# Patient Record
Sex: Male | Born: 1980 | Race: White | Hispanic: No | Marital: Single | Smoking: Never smoker
Health system: Southern US, Community
[De-identification: ages and names within clinical notes are randomized; demographics above are authoritative.]

## PROBLEM LIST (undated history)

## (undated) DIAGNOSIS — I10 Essential (primary) hypertension: Secondary | ICD-10-CM

## (undated) HISTORY — PX: NO PAST SURGERIES: SHX2092

---

## 2009-08-29 ENCOUNTER — Emergency Department: Payer: Self-pay | Admitting: Emergency Medicine

## 2015-02-28 DIAGNOSIS — F32A Depression, unspecified: Secondary | ICD-10-CM | POA: Insufficient documentation

## 2015-02-28 DIAGNOSIS — I10 Essential (primary) hypertension: Secondary | ICD-10-CM | POA: Insufficient documentation

## 2015-08-03 ENCOUNTER — Encounter: Payer: Self-pay | Admitting: Emergency Medicine

## 2015-08-03 ENCOUNTER — Emergency Department
Admission: EM | Admit: 2015-08-03 | Discharge: 2015-08-03 | Disposition: A | Discharge status: Home / Self Care | Payer: 59 | Attending: Emergency Medicine | Admitting: Emergency Medicine

## 2015-08-03 ENCOUNTER — Emergency Department: Payer: 59

## 2015-08-03 DIAGNOSIS — I10 Essential (primary) hypertension: Secondary | ICD-10-CM | POA: Insufficient documentation

## 2015-08-03 DIAGNOSIS — F17210 Nicotine dependence, cigarettes, uncomplicated: Secondary | ICD-10-CM | POA: Insufficient documentation

## 2015-08-03 DIAGNOSIS — M545 Low back pain, unspecified: Secondary | ICD-10-CM

## 2015-08-03 DIAGNOSIS — M546 Pain in thoracic spine: Secondary | ICD-10-CM | POA: Diagnosis not present

## 2015-08-03 DIAGNOSIS — R200 Anesthesia of skin: Secondary | ICD-10-CM | POA: Diagnosis present

## 2015-08-03 HISTORY — DX: Essential (primary) hypertension: I10

## 2015-08-03 LAB — BASIC METABOLIC PANEL
Anion gap: 10 (ref 5–15)
BUN: 11 mg/dL (ref 6–20)
CALCIUM: 9.7 mg/dL (ref 8.9–10.3)
CO2: 22 mmol/L (ref 22–32)
CREATININE: 1.18 mg/dL (ref 0.61–1.24)
Chloride: 108 mmol/L (ref 101–111)
Glucose, Bld: 136 mg/dL — ABNORMAL HIGH (ref 65–99)
Potassium: 3.6 mmol/L (ref 3.5–5.1)
SODIUM: 140 mmol/L (ref 135–145)

## 2015-08-03 LAB — CBC
HCT: 48.8 % (ref 40.0–52.0)
Hemoglobin: 16.7 g/dL (ref 13.0–18.0)
MCH: 30 pg (ref 26.0–34.0)
MCHC: 34.3 g/dL (ref 32.0–36.0)
MCV: 87.4 fL (ref 80.0–100.0)
PLATELETS: 180 10*3/uL (ref 150–440)
RBC: 5.58 MIL/uL (ref 4.40–5.90)
RDW: 13.1 % (ref 11.5–14.5)
WBC: 12.4 10*3/uL — AB (ref 3.8–10.6)

## 2015-08-03 LAB — GLUCOSE, CAPILLARY: GLUCOSE-CAPILLARY: 102 mg/dL — AB (ref 65–99)

## 2015-08-03 LAB — TROPONIN I

## 2015-08-03 MED ORDER — IBUPROFEN 800 MG PO TABS: 800.0000 mg | ORAL_TABLET | Freq: Three times a day (TID) | ORAL | Status: DC | PRN

## 2015-08-03 MED ORDER — IBUPROFEN 800 MG PO TABS
800.0000 mg | ORAL_TABLET | Freq: Once | ORAL | Status: AC
  Administered 2015-08-03: 800 mg via ORAL
  Filled 2015-08-03: qty 1

## 2015-08-03 MED ORDER — SODIUM CHLORIDE 0.9 % IV BOLUS (SEPSIS): 1000.0000 mL | Freq: Once | INTRAVENOUS | Status: DC

## 2015-08-03 NOTE — ED Notes (Signed)
Pt reports that he does not know of any incident that could have caused his back pain. Pt reports that the pain is both sharp and throbbing and comes on in waves. Pt rates pain 10/10 at this time.

## 2015-08-03 NOTE — Discharge Instructions (Signed)
Please apply a heat pack to your back for 15 minutes every 2 hours. Please take Motrin for its pain control and anti-inflammatory processes every 8 hours for the next 3 days. After that take the Motrin as needed.  Please make an appointment with your primary care physician. Please call the office and they will give you a refill on your pressure prescription medicine. Next  Please return to the emergency department if he develops severe pain, numbness, tingling, fainting, fever, inability to walk, or any other symptoms concerning to you.

## 2015-08-03 NOTE — ED Notes (Signed)
Dr. Sharma CovertNorman aware of vitals at d/c.  No additional orders other than to follow up (as discussed) with PMD

## 2015-08-03 NOTE — ED Notes (Signed)
C/o lower back pain that started when he woke up this am, denies any urinary symptoms, also states he started having some left arm numbness that started around 1715

## 2015-08-03 NOTE — ED Provider Notes (Signed)
[facility] Emergency Department Provider Note  ____________________________________________  Time seen: Approximately 7:14 PM  I have reviewed the triage vital signs and the nursing notes.   HISTORY  Chief Complaint Numbness and Back Pain    HPI Jacob Moreno is a 34 y.o. male with a history of obesity and hypertension not currently on medication presenting with mid to low back pain. Patient reports that he was turning over to get out of bed when he had the acute onset of mid back pain. The pain is worse with some positional changes and worse if he lays still for a long time. It does not radiate. He does not have any associated numbness, tingling, urinary or fecal incontinence or retention. He has no changes in his gait. He does have some left upper extremity numbness that has improved since he has been here; he says he gets this numbness "all the time." The numbness seeks experiencing tonight is unchanged in character and severity from his chronic symptoms. Pt denies any chest pain, palpitations, shortness of breath, headache, neck pain, visual changes, gait changes.   Past Medical History  Diagnosis Date  . Hypertension     There are no active problems to display for this patient.   History reviewed. No pertinent past surgical history.  Current Outpatient Rx  Name  Route  Sig  Dispense  Refill  . ibuprofen (ADVIL,MOTRIN) 800 MG tablet   Oral   Take 1 tablet (800 mg total) by mouth every 8 (eight) hours as needed for moderate pain (with food).   20 tablet   0     Allergies Review of patient's allergies indicates no known allergies.  No family history on file.  Social History Social History  Substance Use Topics  . Smoking status: Current Every Day Smoker    Types: Cigars  . Smokeless tobacco: None  . Alcohol Use: No    Review of Systems Constitutional: No fever/chills. No lightheadedness or syncope. Eyes: No visual changes. No blurred  or double vision. ENT: No sore throat. NECK: No neck pain. Cardiovascular: Denies chest pain, palpitations. Respiratory: Denies shortness of breath.  No cough. Gastrointestinal: No abdominal pain.  No nausea, no vomiting.  No diarrhea.  No constipation. Genitourinary: Negative for dysuria. Musculoskeletal: Positive for back pain. Skin: Negative for rash. Neurological: Left upper extremity numbness which is chronic. No headache. No weakness. No changes in gait. No changes in vision or speech. No fecal or urinary incontinence or retention. 10-point ROS otherwise negative.  ____________________________________________   PHYSICAL EXAM:  VITAL SIGNS: ED Triage Vitals  Enc Vitals Group     BP 08/03/15 1825 179/105 mmHg     Pulse Rate 08/03/15 1825 65     Resp 08/03/15 1825 18     Temp 08/03/15 1825 98.6 F (37 C)     Temp Source 08/03/15 1825 Oral     SpO2 08/03/15 1825 100 %     Weight 08/03/15 1825 250 lb (113.399 kg)     Height 08/03/15 1825  (1.803 m)     Head Cir --      Peak Flow --      Pain Score 08/03/15 1826 10     Pain Loc --      Pain Edu? --      Excl. in GC? --     Constitutional: Alert and oriented. Well appearing and in no acute distress. Answer question appropriately. Patient is able to move around in the stretcher  lie down, sit up, and stand up without any obvious pain. Eyes: Conjunctivae are normal.  EOMI. Head: Atraumatic. Nose: No congestion/rhinnorhea. Mouth/Throat: Mucous membranes are moist.  Neck: No stridor.  Supple.  No midline C-spine tenderness to palpation, no step-offs or deformities. Cardiovascular: Normal rate, regular rhythm. No murmurs, rubs or gallops.  Respiratory: Normal respiratory effort.  No retractions. Lungs CTAB.  No wheezes, rales or ronchi. Gastrointestinal: Obese, soft, nontender, nondistended.  Musculoskeletal: No LE edema. 5 out of 5 plantar and dorsiflexion strength, hip flexor strength. No tenderness to palpation in the  midline T or L-spine, no step-offs or deformities. Unable to reproduce pain with palpation diffusely throughout the back in the area that the patient states his pain is. Skin is normal without lesions. Neurologic:  Normal speech and language. Moves all extremities well. Normal sensation to light touch in the face, upper extremities bilaterally, lower extremities bilaterally. Normal gait without ataxia.  Skin:  Skin is warm, dry and intact. No rash noted. Psychiatric: Flat and depressed affect.Marland Kitchen Speech and behavior are normal.  Normal judgement.  ____________________________________________   LABS (all labs ordered are listed, but only abnormal results are displayed)  Labs Reviewed  BASIC METABOLIC PANEL - Abnormal; Notable for the following:    Glucose, Bld 136 (*)    All other components within normal limits  CBC - Abnormal; Notable for the following:    WBC 12.4 (*)    All other components within normal limits  GLUCOSE, CAPILLARY - Abnormal; Notable for the following:    Glucose-Capillary 102 (*)    All other components within normal limits  TROPONIN I  CBG MONITORING, ED   ____________________________________________  EKG  ED ECG REPORT I, Rockne Menghini, the attending physician, personally viewed and interpreted this ECG.   Date: 08/03/2015  EKG Time: 1830  Rate: 58sinus bradycardia  Rhythm: sinus bradycardia  Axis: Leftward  Intervals:none  ST&T Change: Nonspecific T-wave inversion at V1. No ST elevation. Ischemic changes.  ____________________________________________  RADIOLOGY  Ct Head Wo Contrast  08/03/2015  CLINICAL DATA:  lower back pain that started when he woke up this am, denies any urinary symptoms, also states he started having some left arm numbness that started around 1715. Denies trauma. EXAM: CT HEAD WITHOUT CONTRAST TECHNIQUE: Contiguous axial images were obtained from the base of the skull through the vertex without intravenous contrast.  COMPARISON:  None. FINDINGS: There is no evidence of acute intracranial hemorrhage, brain edema, mass lesion, acute infarction, mass effect, or midline shift. Acute infarct may be inapparent on noncontrast CT. No other intra-axial abnormalities are seen, and the ventricles and sulci are within normal limits in size and symmetry. No abnormal extra-axial fluid collections or masses are identified. No significant calvarial abnormality. IMPRESSION: 1. Negative for bleed or other acute intracranial process. Electronically Signed   By: Corlis Leak M.D.   On: 08/03/2015 18:43    ____________________________________________   PROCEDURES  Procedure(s) performed: None  Critical Care performed: No ____________________________________________   INITIAL IMPRESSION / ASSESSMENT AND PLAN / ED COURSE  Pertinent labs & imaging results that were available during my care of the patient were reviewed by me and considered in my medical decision making (see chart for details).  34 y.o. male with a history of obesity and chronic left upper extremity numbness presenting with acute onset of mid to low back pain with movement. Patient does not have any urinary symptoms or infectious symptoms. He does not have any signs of cauda equina or spinal  cord compression. He does not have any signs of acute CVA. The patient's exam and history are not indicative of aortic pathology. The patient does have some hypertension but has been off of his medications; he does not remember which antihypertensive he is supposed to be on. At this time, I will discharge him with treatment with anti-inflammatories and heat therapy. He will call his primary care physician's office tomorrow to get a refill on his chronic antihypertensives.  ____________________________________________  FINAL CLINICAL IMPRESSION(S) / ED DIAGNOSES  Final diagnoses:  Bilateral low back pain without sciatica  Midline thoracic back pain  Left arm numbness       NEW MEDICATIONS STARTED DURING THIS VISIT:  Discharge Medication List as of 08/03/2015  7:23 PM    START taking these medications   Details  ibuprofen (ADVIL,MOTRIN) 800 MG tablet Take 1 tablet (800 mg total) by mouth every 8 (eight) hours as needed for moderate pain (with food)., Starting 08/03/2015, Until Discontinued, Print         Rockne MenghiniAnne-Caroline Donta Mcinroy, MD 08/03/15 2234

## 2016-11-30 IMAGING — CT CT HEAD W/O CM
1 series · 16 of 30 positions shown, 20 images · non-contrast
Comparison: None.

CLINICAL DATA: lower back pain that started when he woke up this
left arm numbness that started around 5859. Denies trauma.

EXAM:
CT HEAD WITHOUT CONTRAST
TECHNIQUE: Contiguous axial images were obtained from the base of the skull
through the vertex without intravenous contrast.

[Series 2: head wo · axial · 0.45mm/px · z∈[+472,+616]mm · 16 of 36 slices shown, 20 images]
[im 2/36  brain]
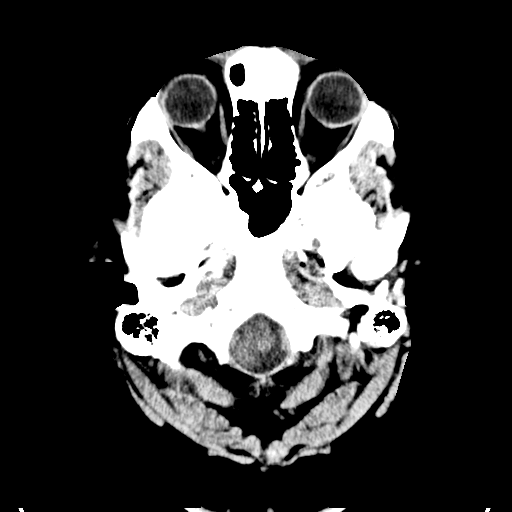
[im 2/36  bone]
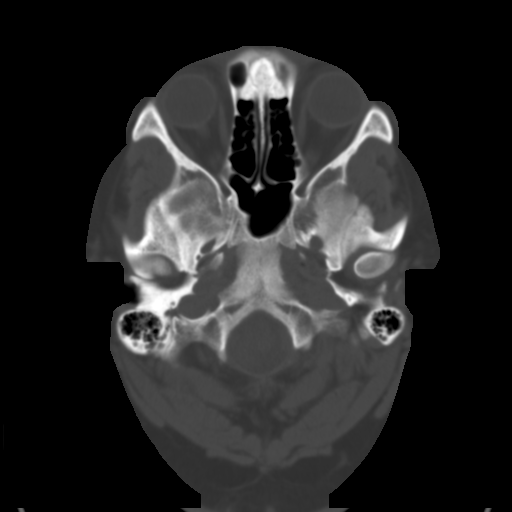
[im 4/36  brain]
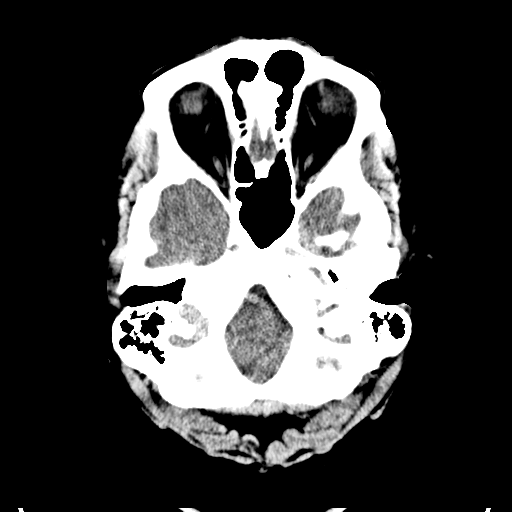
[im 7/36  brain]
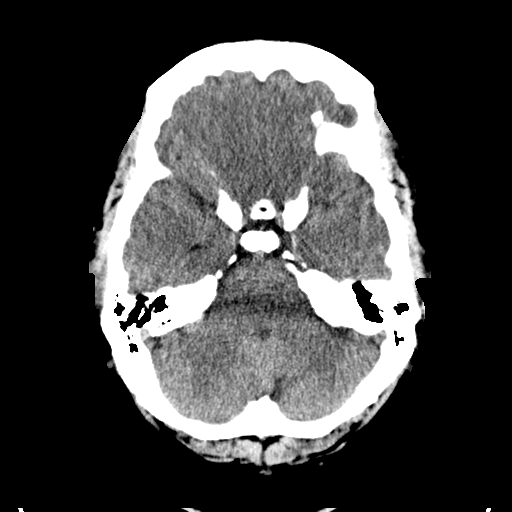
[im 9/36  brain]
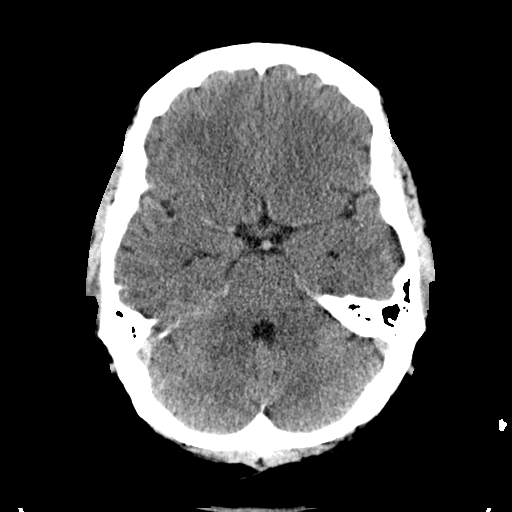
[im 10/36  brain]
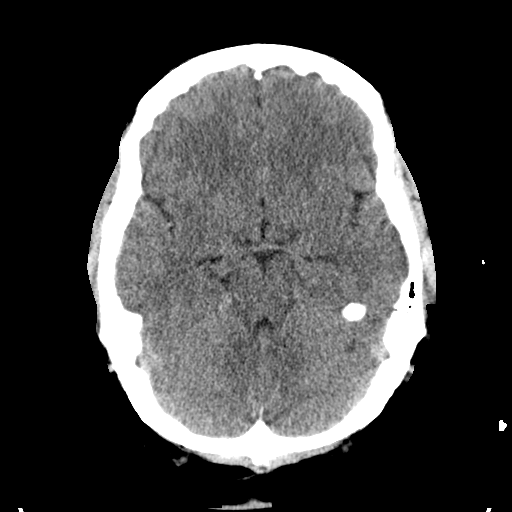
[im 10/36  bone]
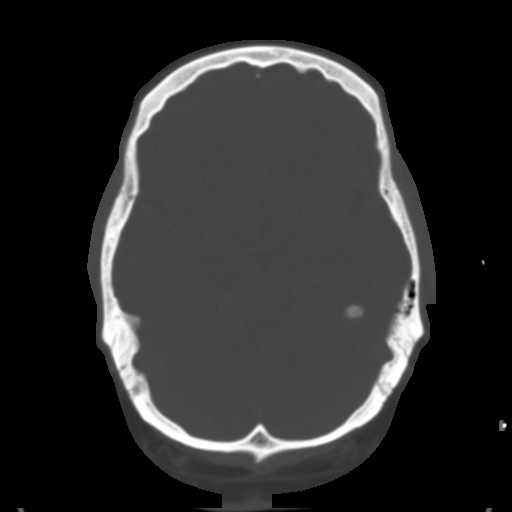
[im 13/36  brain]
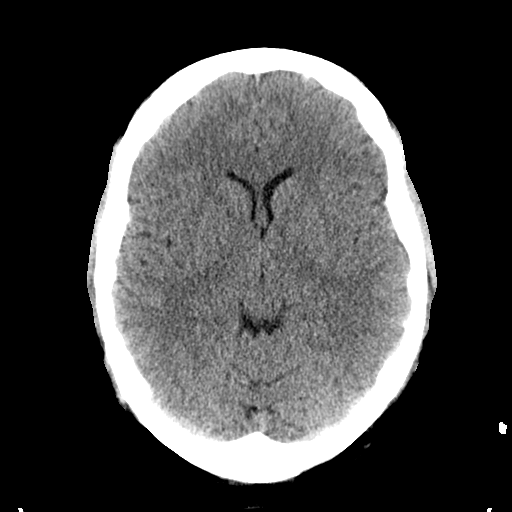
[im 15/36  brain]
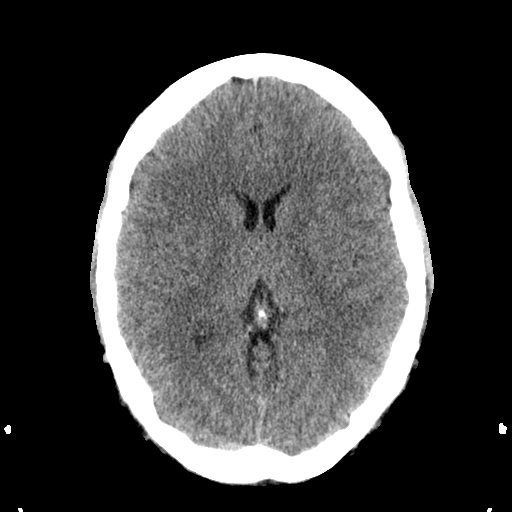
[im 17/36  brain]
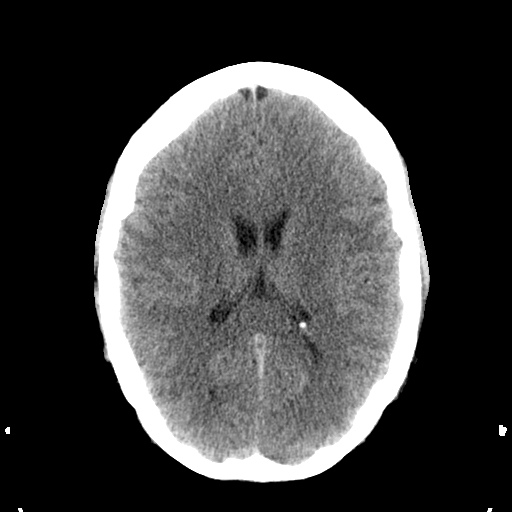
[im 19/36  brain]
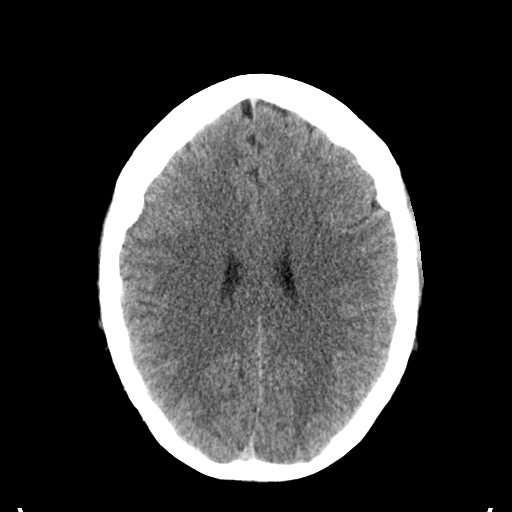
[im 19/36  bone]
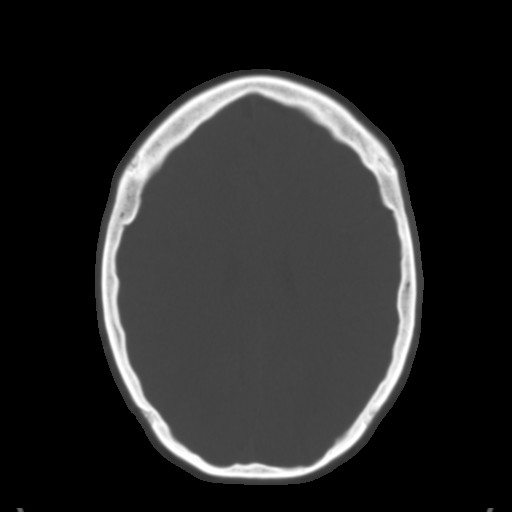
[im 21/36  brain]
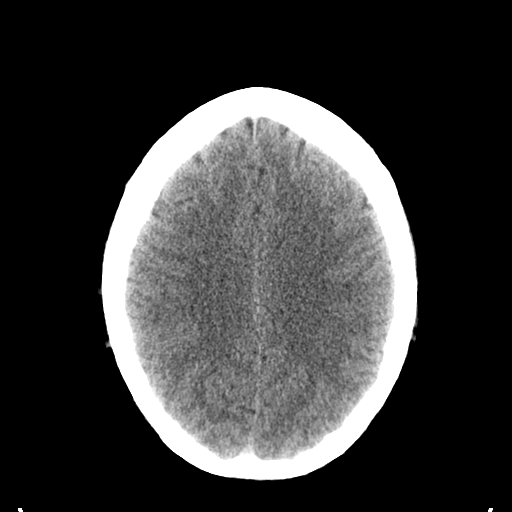
[im 23/36  brain]
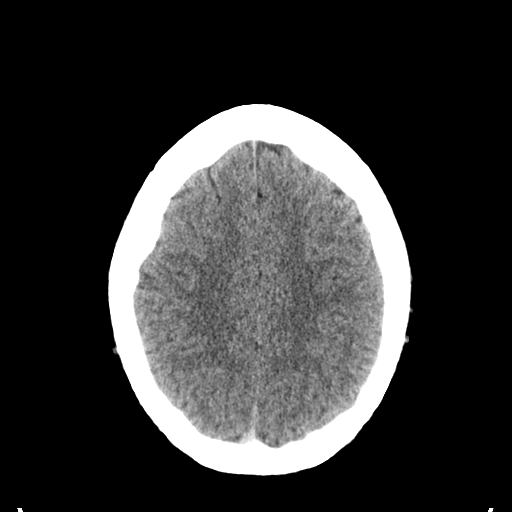
[im 26/36  brain]
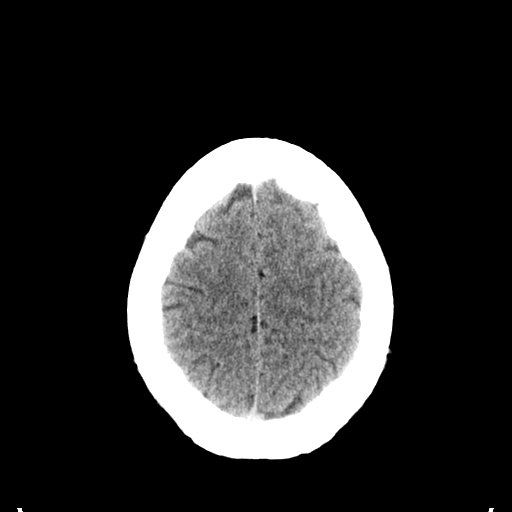
[im 27/36  brain]
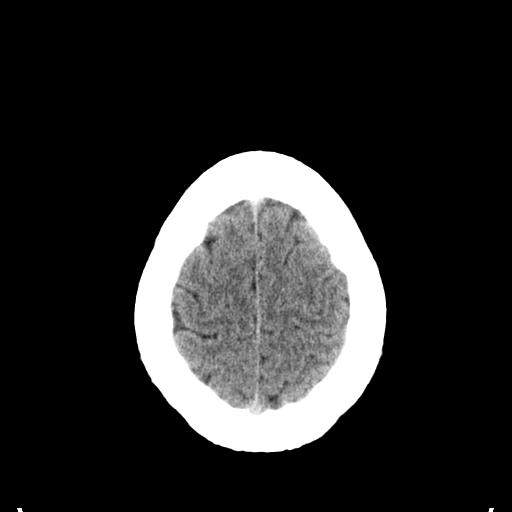
[im 27/36  bone]
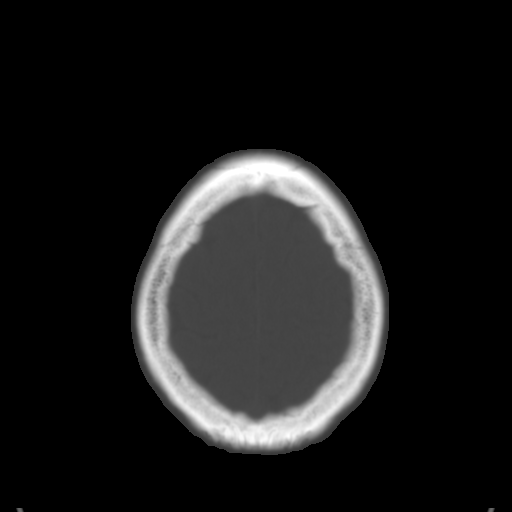
[im 29/36  brain]
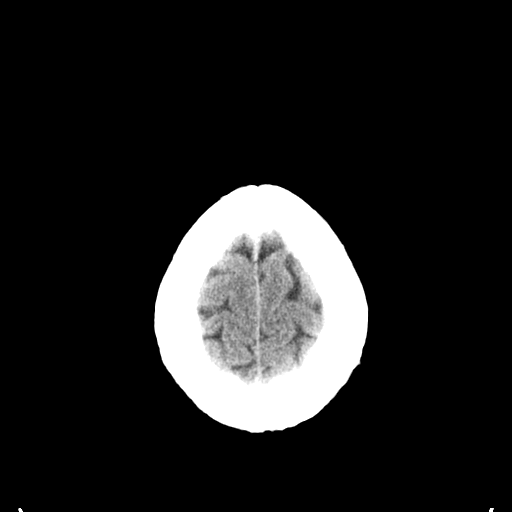
[im 32/36  brain]
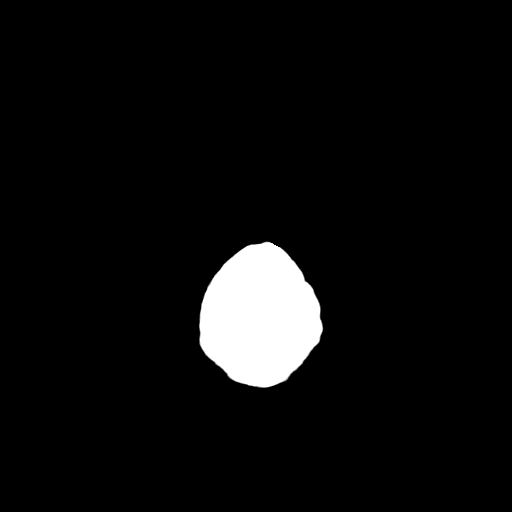
[im 34/36  brain]
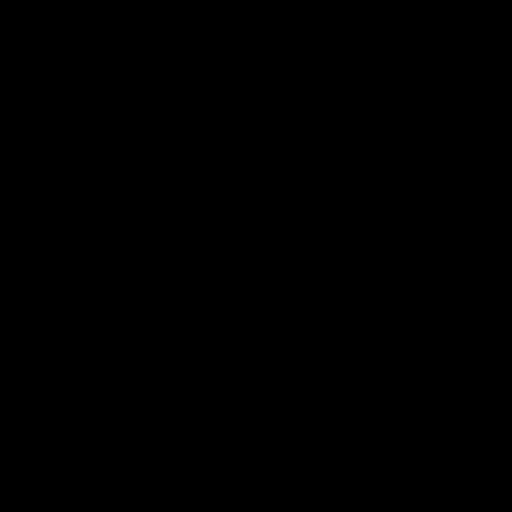

[16 of 30 positions shown; findings below may reference images not displayed]

FINDINGS: There is no evidence of acute intracranial hemorrhage, brain edema,
mass lesion, acute infarction, mass effect, or midline shift. Acute
infarct may be inapparent on noncontrast CT. No other intra-axial
abnormalities are seen, and the ventricles and sulci are within
normal limits in size and symmetry. No abnormal extra-axial fluid
collections or masses are identified. No significant calvarial
abnormality.
IMPRESSION: 1. Negative for bleed or other acute intracranial process.

## 2019-09-20 ENCOUNTER — Emergency Department
Admission: EM | Admit: 2019-09-20 | Discharge: 2019-09-20 | Disposition: A | Discharge status: Home / Self Care | Payer: BC Managed Care – PPO | Attending: Student | Admitting: Student

## 2019-09-20 ENCOUNTER — Other Ambulatory Visit: Payer: Self-pay

## 2019-09-20 ENCOUNTER — Emergency Department: Payer: BC Managed Care – PPO

## 2019-09-20 DIAGNOSIS — R109 Unspecified abdominal pain: Secondary | ICD-10-CM | POA: Diagnosis not present

## 2019-09-20 DIAGNOSIS — I10 Essential (primary) hypertension: Secondary | ICD-10-CM | POA: Insufficient documentation

## 2019-09-20 DIAGNOSIS — F17290 Nicotine dependence, other tobacco product, uncomplicated: Secondary | ICD-10-CM | POA: Diagnosis not present

## 2019-09-20 DIAGNOSIS — R1012 Left upper quadrant pain: Secondary | ICD-10-CM | POA: Diagnosis not present

## 2019-09-20 DIAGNOSIS — N201 Calculus of ureter: Secondary | ICD-10-CM

## 2019-09-20 LAB — COMPREHENSIVE METABOLIC PANEL
ALT: 35 U/L (ref 0–44)
AST: 19 U/L (ref 15–41)
Albumin: 4.3 g/dL (ref 3.5–5.0)
Alkaline Phosphatase: 45 U/L (ref 38–126)
Anion gap: 10 (ref 5–15)
BUN: 16 mg/dL (ref 6–20)
CO2: 26 mmol/L (ref 22–32)
Calcium: 9.2 mg/dL (ref 8.9–10.3)
Chloride: 104 mmol/L (ref 98–111)
Creatinine, Ser: 1.16 mg/dL (ref 0.61–1.24)
GFR calc Af Amer: >60 mL/min (ref >60)
GFR calc non Af Amer: >60 mL/min (ref >60)
Glucose, Bld: 112 mg/dL — ABNORMAL HIGH (ref 70–99)
Potassium: 4.2 mmol/L (ref 3.5–5.1)
Sodium: 140 mmol/L (ref 135–145)
Total Bilirubin: 1.7 mg/dL — ABNORMAL HIGH (ref 0.3–1.2)
Total Protein: 7.5 g/dL (ref 6.5–8.1)

## 2019-09-20 LAB — CBC
HCT: 47.2 % (ref 39.0–52.0)
Hemoglobin: 16.4 g/dL (ref 13.0–17.0)
MCH: 30.1 pg (ref 26.0–34.0)
MCHC: 34.7 g/dL (ref 30.0–36.0)
MCV: 86.8 fL (ref 80.0–100.0)
Platelets: 145 10*3/uL — ABNORMAL LOW (ref 150–400)
RBC: 5.44 MIL/uL (ref 4.22–5.81)
RDW: 12.6 % (ref 11.5–15.5)
WBC: 15 10*3/uL — ABNORMAL HIGH (ref 4.0–10.5)
nRBC: 0 % (ref 0.0–0.2)

## 2019-09-20 LAB — URINALYSIS, ROUTINE W REFLEX MICROSCOPIC
Bacteria, UA: NONE SEEN
Bilirubin Urine: NEGATIVE
Glucose, UA: NEGATIVE mg/dL
Ketones, ur: NEGATIVE mg/dL
Leukocytes,Ua: NEGATIVE
Nitrite: NEGATIVE
Protein, ur: NEGATIVE mg/dL
Specific Gravity, Urine: 1.017 (ref 1.005–1.030)
Squamous Epithelial / LPF: NONE SEEN (ref 0–5)
pH: 6 (ref 5.0–8.0)

## 2019-09-20 MED ORDER — OXYCODONE HCL 5 MG PO TABS: 5.0000 mg | ORAL_TABLET | Freq: Four times a day (QID) | ORAL | 0 refills | Status: AC | PRN

## 2019-09-20 MED ORDER — HYDROCHLOROTHIAZIDE 12.5 MG PO TABS: 12.5000 mg | ORAL_TABLET | Freq: Every day | ORAL | 0 refills | Status: DC

## 2019-09-20 MED ORDER — ONDANSETRON 4 MG PO TBDP
4.0000 mg | ORAL_TABLET | Freq: Once | ORAL | Status: AC
  Administered 2019-09-20: 16:00:00 4 mg via ORAL
  Filled 2019-09-20: qty 1

## 2019-09-20 MED ORDER — OXYCODONE HCL 5 MG PO TABS
5.0000 mg | ORAL_TABLET | Freq: Once | ORAL | Status: AC
  Administered 2019-09-20: 16:00:00 5 mg via ORAL
  Filled 2019-09-20: qty 1

## 2019-09-20 MED ORDER — ONDANSETRON HCL 4 MG PO TABS: 4.0000 mg | ORAL_TABLET | Freq: Three times a day (TID) | ORAL | 0 refills | Status: AC | PRN

## 2019-09-20 NOTE — ED Provider Notes (Signed)
[facility] Emergency Department Provider Note  ____________________________________________   First MD Initiated Contact with Patient 09/20/19 1514     (approximate)  I have reviewed the triage vital signs and the nursing notes.  History  Chief Complaint Flank Pain    HPI Jacob Moreno is a 39 y.o. male who presents emergency department for left-sided flank pain.  Patient states his symptoms first started on Friday.  Pain has been constant since onset, but acutely worsened in severity last night, prompting evaluation.  Pain is located to the left flank, radiates somewhat into his groin.  Described as sharp.  6/10 in severity.  No alleviating or aggravating factors.  He has some discomfort with urination, but denies any hematuria or malodorous urine.  He denies any history of kidney stones.  One episode of vomiting earlier today, which he attributes to pain.  None since then.  No fevers.  As an aside, patient has a history of hypertension, was previously on medication but has not been on anything recently due to financial constraints.  He states he now has insurance and would like to restart his medications.  Was previously on hydrochlorothiazide daily and tolerated it well.  No history of kidney disease.   Past Medical Hx Past Medical History:  Diagnosis Date  . Hypertension     Problem List There are no problems to display for this patient.   Past Surgical Hx No past surgical history on file.  Medications Prior to Admission medications   Medication Sig Start Date End Date Taking? Authorizing Provider  ibuprofen (ADVIL,MOTRIN) 800 MG tablet Take 1 tablet (800 mg total) by mouth every 8 (eight) hours as needed for moderate pain (with food). 08/03/15   Rockne Menghini, MD    Allergies Patient has no known allergies.  Family Hx No family history on file.  Social Hx Social History   Tobacco Use  . Smoking status: Current Every Day  Smoker    Types: Cigars  Substance Use Topics  . Alcohol use: No  . Drug use: Not on file     Review of Systems  Constitutional: Negative for fever, chills. Eyes: Negative for visual changes. ENT: Negative for sore throat. Cardiovascular: Negative for chest pain. Respiratory: Negative for shortness of breath. Gastrointestinal: Negative for nausea, vomiting.  Genitourinary: + for dysuria and LEFT flank pain. Musculoskeletal: Negative for leg swelling. Skin: Negative for rash. Neurological: Negative for for headaches.   Physical Exam  Vital Signs: ED Triage Vitals  Enc Vitals Group     BP 09/20/19 1436 (!) 190/115     Pulse Rate 09/20/19 1436 71     Resp 09/20/19 1436 18     Temp 09/20/19 1436 99.3 F (37.4 C)     Temp Source 09/20/19 1436 Oral     SpO2 09/20/19 1436 98 %     Weight 09/20/19 1453 238 lb (108 kg)     Height 09/20/19 1453 5\' 10"  (1.778 m)     Head Circumference --      Peak Flow --      Pain Score 09/20/19 1452 6     Pain Loc --      Pain Edu? --      Excl. in GC? --     Constitutional: Alert and oriented.  Head: Normocephalic. Atraumatic. Eyes: Conjunctivae clear. Sclera anicteric. Nose: No congestion. No rhinorrhea. Mouth/Throat: Wearing mask.  Neck: No stridor.   Cardiovascular: Normal rate, regular rhythm. Extremities well perfused. Respiratory: Normal  respiratory effort.  Lungs CTAB. Gastrointestinal: Soft. Non-tender. Non-distended.  Musculoskeletal: No lower extremity edema. No deformities. Neurologic:  Normal speech and language. No gross focal neurologic deficits are appreciated.  Skin: Skin is warm, dry and intact. No rash noted. Psychiatric: Mood and affect are appropriate for situation.   Radiology  CT Renal: IMPRESSION:  1. Mild to moderate left hydronephrosis due to a 0.3 cm distal left  ureteral stone. The stone is just above the level of the left hip.  2. Multiple small nonobstructing stones in both kidneys, more  numerous  on the right.  3. Fatty infiltration of the liver.  4. Gallstones without evidence of cholecystitis.    Procedures  Procedure(s) performed (including critical care):  Procedures   Initial Impression / Assessment and Plan / ED Course  39 y.o. male who presents to the ED for LEFT sided flank pain, as above. History suggestive of nephrolithiasis  Labs reveal mild leukocytosis. UA w/o evidence of infection. CT confirms left sided stone, as above. No evidence of concomitant infection. Pain well controlled in the ED and tolerated PO. As such, will plan for discharge with Rx for pain, nausea control and urology follow up. Will restart his HCTZ per his request given Cr within normal limits and given referral for re-establishing PCP. Patient comfortable w/ plan and discharge. Given return precautions.     Final Clinical Impression(s) / ED Diagnosis  Final diagnoses:  Left flank pain  Ureterolithiasis       Note:  This document was prepared using Dragon voice recognition software and may include unintentional dictation errors.   Lilia Pro., MD 09/20/19 2006

## 2019-09-20 NOTE — Discharge Instructions (Addendum)
Thank you for letting us take care of you in the emergency department today.   Please continue to take any regular, prescribed medications.   New medications we have prescribed:  - blood pressure medicine (hydrochlorothiazide)  - pain medication - take only as needed for pain you cannot control with over the counter ibuprofen or Tylenol - nausea medication - take as needed  Please follow up with: - A primary care doctor to establish care, information below - Urology doctor to follow up on your stone, info below  Please return to the ER for any new or worsening symptoms.

## 2019-09-20 NOTE — ED Triage Notes (Addendum)
Pt states he started having left sided flank pain on Friday, states that the pain was much worse last night, states that he has noticed some discomfort with urination as well, denies hx of kidney stone. Pt states his last dose of bp med was a year ago, so states that his pressure is high when he doesn't take his meds, pt states he now has insurance and would like to get back on it

## 2019-09-20 NOTE — ED Notes (Signed)
Signature pad not working. Pt verbal understanding of discharge instructions, no further questions or concerns.

## 2019-09-20 NOTE — ED Notes (Signed)
Pt declined a snack when offered, states he will eat when he leaves the hospital .

## 2019-09-20 NOTE — ED Notes (Signed)
Pt drinking ice water with no complaints of N/V

## 2019-09-20 NOTE — ED Notes (Signed)
Patient transported to CT 

## 2019-09-20 NOTE — ED Notes (Signed)
See triage note, pt states he is here because he believes he has kidney stones. States he is having left sided flank pain that started on Friday and worsened yesterday. Reports pain with urination.  Pt in NAD at this time

## 2020-01-11 NOTE — Progress Notes (Signed)
Patient ID: Jacob Moreno, male    DOB: Aug 18, 1981, 39 y.o.   MRN: [id number]  PCP: Jamelle Haring, MD  Chief Complaint  Patient presents with  . New Patient (Initial Visit)  . Establish Care    Subjective:   Jacob Moreno is a 39 y.o. male, presents to clinic with CC of the following:  Chief Complaint  Patient presents with  . New Patient (Initial Visit)  . Establish Care    HPI:  Patient is a 39 year old male who presents today new to the practice He has a history of hypertension, kidney stones, fatty liver and anxiety/depression.  He was seen in the emergency room 09/20/2019 for left flank pain, diagnosed with a kidney stone At that time, he was noted to be off his medications, and hydrochlorothiazide was restarted for better control of his blood pressure (was 190/115 on that visit) CT scan from that visit showed: CT Renal: IMPRESSION:  1. Mild to moderate left hydronephrosis due to a 0.3 cm distal left  ureteral stone. The stone is just above the level of the left hip.  2. Multiple small nonobstructing stones in both kidneys, more  numerous on the right.  3. Fatty infiltration of the liver.  4. Gallstones without evidence of cholecystitis  He noted symptoms resolved, and he did not need any procedures.  He has had no recurrence of concern.  H/o Anxiety/ Depression: Was seen in June 2019 with a history of depression noted at that visit, and also increasing symptoms after being fired from his job unexpectedly in November 2018.  He was struggling with some sleepless nights, racing thoughts, some emotional instability and anxiety, with Zoloft prescribed at that time.  He noted he did not feel good on that medicine, and stopped it. 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  He states he has handled this pretty well adjusting to this, denies feeling markedly depressed at  this point, no thoughts of hurting self or others.  He does not feel like he needs to talk to counseling, nor does he feel like things are problematic to discuss potential medications.  Essential hypertension: Has history of hypertension, previously well-controlled on HCTZ however stopped taking this due to financial limitations in December 2018.  His hydrochlorothiazide was restarted again on follow-up visit noted his blood pressure high, although he stopped it again as he lost his insurance to help cover.  He has not been taking a medication in the recent past. He denied any chest pains, shortness of breath, increased leg swelling, headaches, or vision changes.  He did note at times he feels palpitations, like his heartbeat is just more noticeable, and often can occur when laying down at nighttime. He noted he can check his blood pressures at home, although he has not done so in the recent past.  His blood pressure was elevated when he went to the emergency room in January with a kidney stone issue.  Obesity His weight was highest at 320 pounds in 2005, then lost a lot of weight and got down to 175 pounds in 2006.  He then had an increased weight again, blaming it on increased THC use and laziness.  He is now trying to lose weight again, and has had some mild success in the recent past. He gets up very early in the morning to get to the gym and exercises  5 times a week, although notes he has not in the last 3 weeks.  Before the Covid pandemic, he was doing a type of martial arts regimen that was keeping him active.  That place closed with the pandemic and and went out of business. Discussed importance of dietary compliance to help with weight loss, and he has tried to not only work on portion control but eating healthier to help. Wt Readings from Last 3 Encounters:  01/12/20 237 lb 12.8 oz (107.9 kg)  09/20/19 238 lb (108 kg)  08/03/15 250 lb (113.4 kg)   Fatty Liver  Was a finding noted on the CT  above  Recurrent shoulder dislocation- He noticed the first time he did this was in 1999, and states since that time it has happened about 20 times.  Tried to clarify if it was subluxation versus true dislocation, and not entirely clear.  The last episode was in February 2020.  It has not happened since.  Not limited presently.  Tobacco-never smoker Alcohol-denied   Current Outpatient Medications:  .  ibuprofen (ADVIL,MOTRIN) 800 MG tablet, Take 1 tablet (800 mg total) by mouth every 8 (eight) hours as needed for moderate pain (with food)., Disp: 20 tablet, Rfl: 0   No Known Allergies   History reviewed. No pertinent surgical history.   History reviewed. No pertinent family history.   Social History   Tobacco Use  . Smoking status: Never Smoker  . Smokeless tobacco: Never Used  Substance Use Topics  . Alcohol use: No    With staff assistance, above reviewed with the patient today.  ROS: As per HPI, denies any chronic abdominal pains, no dark black stools, no bleeding per rectum, no chronic diarrhea, no dysuria, no STD symptoms of concern, no testicle swelling or hernia history, no balance difficulties, no history of thyroid disease, noted occasional heel pain in the morning, like a plantar fasciitis with plantar fascial stretching exercises recommended, otherwise no specific complaints on a limited and focused system review   No results found for this or any previous visit (from the past 72 hour(s)).   PHQ2/9: Depression screen [facility] 2/9 01/12/2020  Decreased Interest 0  Down, Depressed, Hopeless 0  PHQ - 2 Score 0  Altered sleeping 1  Tired, decreased energy 1  Change in appetite 0  Feeling bad or failure about yourself  1  Trouble concentrating 0  Moving slowly or fidgety/restless 0  Suicidal thoughts 0  PHQ-9 Score 3  Difficult doing work/chores Not difficult at all   PHQ-2/9 Result reviewed  Fall Risk: Fall Risk  01/12/2020  Falls in the past year? 0  Number  falls in past yr: 0  Injury with Fall? 0      Objective:   Vitals:   01/12/20 1437 01/12/20 1449  BP: (!) 200/110 (!) 190/108  Pulse: 89   Resp: 16   Temp: (!) 97.5 F (36.4 C)   TempSrc: Temporal   SpO2: 98%   Weight: 237 lb 12.8 oz (107.9 kg)   Height: 5\' 10"  (1.778 m)     Body mass index is 34.12 kg/m. Initial recheck by was 190/108 5 to 7 minutes after the first check Rechecked by myself during the physical was 182/102 on the left with a large adult cuff Physical Exam   NAD, masked, pleasant HEENT - [place]/AT, sclera anicteric, PERRL, EOMI, conj - non-inj'ed, TM's and canals clear, pharynx clear Neck - supple, no adenopathy, no TM, carotids 2+ and = without bruits  bilat Car - RRR without m/g/r Pulm- RR and effort normal at rest, CTA without wheeze or rales Abd - soft, NT, obese, + stria, ND, BS+,  no masses, no obvious HSM Back - no CVA tenderness Skin- no rash noted on exposed areas,  Ext - no LE edema, no active joints Neuro/psychiatric - affect was not flat, appropriate with conversation  Alert and oriented  Grossly non-focal - good strength on testing extremities, sensation intact to LT in distal extremities, DTRs 2+ and equal in the patella, Romberg negative, no pronator drift, good finger-to-nose, gait normal with good tandem walk  Speech normal   Results for orders placed or performed during the hospital encounter of 09/20/19  Comprehensive metabolic panel  Result Value Ref Range   Sodium 140 135 - 145 mmol/L   Potassium 4.2 3.5 - 5.1 mmol/L   Chloride 104 98 - 111 mmol/L   CO2 26 22 - 32 mmol/L   Glucose, Bld 112 (H) 70 - 99 mg/dL   BUN 16 6 - 20 mg/dL   Creatinine, Ser 7.78 0.61 - 1.24 mg/dL   Calcium 9.2 8.9 - 24.2 mg/dL   Total Protein 7.5 6.5 - 8.1 g/dL   Albumin 4.3 3.5 - 5.0 g/dL   AST 19 15 - 41 U/L   ALT 35 0 - 44 U/L   Alkaline Phosphatase 45 38 - 126 U/L   Total Bilirubin 1.7 (H) 0.3 - 1.2 mg/dL   GFR calc non Af Amer >60 >60 mL/min    GFR calc Af Amer >60 >60 mL/min   Anion gap 10 5 - 15  CBC  Result Value Ref Range   WBC 15.0 (H) 4.0 - 10.5 K/uL   RBC 5.44 4.22 - 5.81 MIL/uL   Hemoglobin 16.4 13.0 - 17.0 g/dL   HCT 35.3 61.4 - 43.1 %   MCV 86.8 80.0 - 100.0 fL   MCH 30.1 26.0 - 34.0 pg   MCHC 34.7 30.0 - 36.0 g/dL   RDW 54.0 08.6 - 76.1 %   Platelets 145 (L) 150 - 400 K/uL   nRBC 0.0 0.0 - 0.2 %  Urinalysis, Routine w reflex microscopic  Result Value Ref Range   Color, Urine YELLOW (A) YELLOW   APPearance CLEAR (A) CLEAR   Specific Gravity, Urine 1.017 1.005 - 1.030   pH 6.0 5.0 - 8.0   Glucose, UA NEGATIVE NEGATIVE mg/dL   Hgb urine dipstick SMALL (A) NEGATIVE   Bilirubin Urine NEGATIVE NEGATIVE   Ketones, ur NEGATIVE NEGATIVE mg/dL   Protein, ur NEGATIVE NEGATIVE mg/dL   Nitrite NEGATIVE NEGATIVE   Leukocytes,Ua NEGATIVE NEGATIVE   RBC / HPF 6-10 0 - 5 RBC/hpf   WBC, UA 0-5 0 - 5 WBC/hpf   Bacteria, UA NONE SEEN NONE SEEN   Squamous Epithelial / LPF NONE SEEN 0 - 5   Mucus PRESENT        Assessment & Plan:    1. Encounter to establish care with new doctor   2. Kidney stones History of recent kidney stone obstruction concern, seen in the ER in January 2021.  Symptoms have resolved, with the work-up showing multiple stones in the kidneys. Importance of staying hydrated was noted  3. Essential hypertension Blood pressure today was quite high, and likely has been high in the recent past as well. Educated on this with our goals, and starting a combination medicine best given the readings today. We will start a lisinopril/HCTZ medicine-take daily Also labs ordered. - BASIC  METABOLIC PANEL WITH GFR - CBC with Differential/Platelet - lisinopril-hydrochlorothiazide (ZESTORETIC) 10-12.5 MG tablet; Take 1 tablet by mouth daily.  Dispense: 90 tablet; Refill: 3  4. Fatty liver Educated on this, and monitor presently.  Weight loss will be helpful. - BASIC METABOLIC PANEL WITH GFR   5. Encounter for  hepatitis C screening test for low risk patient He agreed to have the screening today. - Hepatitis C antibody  6. Screening for HIV (human immunodeficiency virus) He agreed to have the screening today - HIV Antibody (routine testing w rflx)  7. Anxiety with depression Some reactive depressive symptoms noted recently, almost appropriately so given what events played out.  He noted he has handled this pretty well today.  Do not feel rushing to any medication is needed presently, nor did the patient, and continue to monitor. - TSH  8. Screening for lipid disorders Asked about checking a cholesterol, and he agreed to do so today. - Lipid panel  9. Recurrent dislocation of right shoulder Discussed concerns with recurrent dislocation, and if happening with any regularity, the need to see an orthopedist and potentially have surgery as there are increased risks each time a dislocation occurs with fracture issues.  He has not had a dislocation since February 2020, and again unclear if some of these were more subluxations than dislocations. Agreed to monitor at this point, and if having more concerns,a referral will be needed.  Await lab results presently, and recommended a follow-up in about 3 weeks time to recheck.  Do want to see his blood pressure getting under better control fairly quickly, and he noted he can check his blood pressures at home to help get data points to assess, and recommended starting about 5 days after he begins the medication and writing these numbers down. Follow-up sooner as needed.       Towanda Malkin, MD 01/12/20 2:51 PM

## 2020-01-12 ENCOUNTER — Ambulatory Visit (INDEPENDENT_AMBULATORY_CARE_PROVIDER_SITE_OTHER): Payer: BC Managed Care – PPO | Admitting: Internal Medicine

## 2020-01-12 ENCOUNTER — Encounter: Payer: Self-pay | Admitting: Internal Medicine

## 2020-01-12 ENCOUNTER — Other Ambulatory Visit: Payer: Self-pay

## 2020-01-12 VITALS — BP 190/108 | HR 89 | Temp 97.5°F | Resp 16 | Ht 70.0 in | Wt 237.8 lb

## 2020-01-12 DIAGNOSIS — Z7689 Persons encountering health services in other specified circumstances: Secondary | ICD-10-CM | POA: Diagnosis not present

## 2020-01-12 DIAGNOSIS — Z1159 Encounter for screening for other viral diseases: Secondary | ICD-10-CM

## 2020-01-12 DIAGNOSIS — K760 Fatty (change of) liver, not elsewhere classified: Secondary | ICD-10-CM

## 2020-01-12 DIAGNOSIS — Z13220 Encounter for screening for lipoid disorders: Secondary | ICD-10-CM

## 2020-01-12 DIAGNOSIS — N200 Calculus of kidney: Secondary | ICD-10-CM | POA: Insufficient documentation

## 2020-01-12 DIAGNOSIS — M24411 Recurrent dislocation, right shoulder: Secondary | ICD-10-CM

## 2020-01-12 DIAGNOSIS — I10 Essential (primary) hypertension: Secondary | ICD-10-CM | POA: Diagnosis not present

## 2020-01-12 DIAGNOSIS — Z114 Encounter for screening for human immunodeficiency virus [HIV]: Secondary | ICD-10-CM

## 2020-01-12 DIAGNOSIS — F418 Other specified anxiety disorders: Secondary | ICD-10-CM

## 2020-01-12 MED ORDER — LISINOPRIL-HYDROCHLOROTHIAZIDE 10-12.5 MG PO TABS: 1.0000 | ORAL_TABLET | Freq: Every day | ORAL | 3 refills | Status: AC

## 2020-01-12 NOTE — Patient Instructions (Signed)

## 2020-01-13 LAB — CBC WITH DIFFERENTIAL/PLATELET
Absolute Monocytes: 812 cells/uL (ref 200–950)
Basophils Absolute: 28 cells/uL (ref 0–200)
Basophils Relative: 0.4 %
Eosinophils Absolute: 161 cells/uL (ref 15–500)
Eosinophils Relative: 2.3 %
HCT: 46.9 % (ref 38.5–50.0)
Hemoglobin: 15.7 g/dL (ref 13.2–17.1)
Lymphs Abs: 1610 cells/uL (ref 850–3900)
MCH: 30 pg (ref 27.0–33.0)
MCHC: 33.5 g/dL (ref 32.0–36.0)
MCV: 89.7 fL (ref 80.0–100.0)
MPV: 11.8 fL (ref 7.5–12.5)
Monocytes Relative: 11.6 %
Neutro Abs: 4389 cells/uL (ref 1500–7800)
Neutrophils Relative %: 62.7 %
Platelets: 164 10*3/uL (ref 140–400)
RBC: 5.23 10*6/uL (ref 4.20–5.80)
RDW: 13 % (ref 11.0–15.0)
Total Lymphocyte: 23 %
WBC: 7 10*3/uL (ref 3.8–10.8)

## 2020-01-13 LAB — BASIC METABOLIC PANEL WITH GFR
BUN: 13 mg/dL (ref 7–25)
CO2: 27 mmol/L (ref 20–32)
Calcium: 9.5 mg/dL (ref 8.6–10.3)
Chloride: 106 mmol/L (ref 98–110)
Creat: 0.94 mg/dL (ref 0.60–1.35)
GFR, Est African American: 118 mL/min/{1.73_m2} (ref >=60)
GFR, Est Non African American: 102 mL/min/{1.73_m2} (ref >=60)
Glucose, Bld: 78 mg/dL (ref 65–99)
Potassium: 4.3 mmol/L (ref 3.5–5.3)
Sodium: 142 mmol/L (ref 135–146)

## 2020-01-13 LAB — LIPID PANEL
Cholesterol: 183 mg/dL (ref <200)
HDL: 44 mg/dL (ref >=40)
LDL Cholesterol (Calc): 102 mg/dL (calc) — ABNORMAL HIGH
Non-HDL Cholesterol (Calc): 139 mg/dL (calc) — ABNORMAL HIGH (ref <130)
Total CHOL/HDL Ratio: 4.2 (calc) (ref <5.0)
Triglycerides: 257 mg/dL — ABNORMAL HIGH (ref <150)

## 2020-01-13 LAB — HIV ANTIBODY (ROUTINE TESTING W REFLEX): HIV 1&2 Ab, 4th Generation: NONREACTIVE

## 2020-01-13 LAB — HEPATITIS C ANTIBODY
Hepatitis C Ab: NONREACTIVE
SIGNAL TO CUT-OFF: 0.01 (ref <1.00)

## 2020-01-13 LAB — TSH: TSH: 1.44 mIU/L (ref 0.40–4.50)

## 2020-02-01 NOTE — Progress Notes (Signed)
Patient ID: Jacob Moreno, male    DOB: [birth year], 39 y.o.   MRN: [id number]  PCP: Towanda Malkin, MD  Chief Complaint  Patient presents with  . Follow-up  . Hypertension  . Hyperlipidemia    Subjective:   Jacob Moreno is a 39 y.o. male, presents to clinic with CC of the following:  Chief Complaint  Patient presents with  . Follow-up  . Hypertension  . Hyperlipidemia    HPI:  Patient is a 39 year old male who recently established with the practice on 01/12/2020. His blood pressure was elevated on that visit and Zestoretic initiated Follows up today   Essential hypertension:  Medication regimen - lisinopril/HCTZ 10/12.5 mg daily Now is checking BP's at home and they have been running in the 150s over low 80s on average.  His cuff does go around the arm, and may be a little snug.  Asked to bring his machine to his next visit to check. Has been taking his medicine regularly. BP Readings from Last 3 Encounters:  02/02/20 118/76  01/12/20 (!) 190/108  09/20/19 (!) 188/100   He denied any chest pains, shortness of breath, increased leg swelling, increased headaches, or vision changes.   Obesity His weight was highest at 320 pounds in 2005, then lost a lot of weight and got down to 175 pounds in 2006.  He then had an increased weight again, blaming it on increased THC use and laziness.   He is now trying to lose weight again, and has had some mild success in the recent past. He has not been back in the gym too much recently as he is battling his plantar fasciitis, with stretches helping as was recommended previously. He has cut your wine out of his diet and has been watching his diet better in the recent past.  I did note to him he has lost about 10 pounds from the prior visit. Wt Readings from Last 3 Encounters:  02/02/20 227 lb (103 kg)  01/12/20 237 lb 12.8 oz (107.9 kg)  09/20/19 238 lb (108 kg)    Hyperlipidemia Not on  medications  presently Dietary modifications recommended to help presently Lab Results  Component Value Date   CHOL 183 01/12/2020   HDL 44 01/12/2020   LDLCALC 102 (H) 01/12/2020   TRIG 257 (H) 01/12/2020   CHOLHDL 4.2 01/12/2020   Fatty Liver  Was a finding noted on the CT above  kidney stone  symptoms remain resolved  H/o Anxiety/ Depression: Was seen in June 2019 with a history of depression noted at that visit, and also increasing symptoms after being fired from his job unexpectedly in November 2018.  He was struggling with some sleepless nights, racing thoughts, some emotional instability and anxiety, with Zoloft prescribed at that time.  He noted he did not feel good on that medicine, and stopped it. 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  He denies any increase in depressive symptoms of concern, with his PHQ-9 today reviewed as well and unchanged.  Recurrent shoulder dislocation- He noticed the first time he did this was in 1999, and states since that time it has happened about 20 times.  Tried to clarify if it was subluxation versus true dislocation, and not entirely clear.  The last episode was in  February 2020.  It has not happened since and he continues to be careful with lifting in the gym as a result.  Patient Active Problem List   Diagnosis Date Noted  . Kidney stones 01/12/2020  . Essential hypertension 01/12/2020  . Fatty liver 01/12/2020  . Anxiety with depression 01/12/2020  . Recurrent dislocation of right shoulder 01/12/2020      Current Outpatient Medications:  .  ibuprofen (ADVIL,MOTRIN) 800 MG tablet, Take 1 tablet (800 mg total) by mouth every 8 (eight) hours as needed for moderate pain (with food)., Disp: 20 tablet, Rfl: 0 .  lisinopril-hydrochlorothiazide (ZESTORETIC) 10-12.5 MG tablet, Take 1  tablet by mouth daily., Disp: 90 tablet, Rfl: 3   No Known Allergies   No past surgical history on file.   No family history on file.   Social History   Tobacco Use  . Smoking status: Never Smoker  . Smokeless tobacco: Never Used  Substance Use Topics  . Alcohol use: No    With staff assistance, above reviewed with the patient today.  ROS: As per HPI, otherwise no specific complaints on a limited and focused system review   No results found for this or any previous visit (from the past 72 hour(s)).   PHQ2/9: Depression screen [facility] 2/9 02/02/2020 01/12/2020  Decreased Interest 0 0  Down, Depressed, Hopeless 0 0  PHQ - 2 Score 0 0  Altered sleeping 1 1  Tired, decreased energy 1 1  Change in appetite 0 0  Feeling bad or failure about yourself  1 1  Trouble concentrating 0 0  Moving slowly or fidgety/restless 0 0  Suicidal thoughts 0 0  PHQ-9 Score 3 3  Difficult doing work/chores Not difficult at all Not difficult at all   PHQ-2/9 Result reviewed  Fall Risk: Fall Risk  02/02/2020 01/12/2020  Falls in the past year? 0 0  Number falls in past yr: 0 0  Injury with Fall? 0 0      Objective:   Vitals:   02/02/20 1355  BP: 118/76  Pulse: 89  Resp: 16  Temp: 98.3 F (36.8 C)  TempSrc: Temporal  SpO2: 99%  Weight: 227 lb (103 kg)  Height: 5\' 10"  (1.778 m)    Body mass index is 32.57 kg/m. Recheck blood pressure by myself was 115/78 with large adult cuff on the left Physical Exam   NAD, masked, pleasant HEENT - [place]/AT, sclera anicteric, PERRL, EOMI, conj - non-inj'ed,  pharynx clear Neck - supple, no adenopathy, no TM, carotids 2+ and = without bruits bilat Car - RRR without m/g/r Pulm- RR and effort normal at rest, CTA without wheeze or rales Abd - soft, NT diffusely, mildly obese Ext - no LE edema,  Neuro/psychiatric - affect was not flat, appropriate with conversation  Alert and oriented  Grossly non-focal   Speech  normal   Results for orders  placed or performed in visit on 01/12/20  BASIC METABOLIC PANEL WITH GFR  Result Value Ref Range   Glucose, Bld 78 65 - 99 mg/dL   BUN 13 7 - 25 mg/dL   Creat 01/14/20 6.06 - 3.01 mg/dL   GFR, Est Non African American 102 > OR = 60 mL/min/1.55m2   GFR, Est African American 118 > OR = 60 mL/min/1.80m2   BUN/Creatinine Ratio NOT APPLICABLE 6 - 22 (calc)   Sodium 142 135 - 146 mmol/L   Potassium 4.3 3.5 - 5.3 mmol/L   Chloride 106 98 - 110 mmol/L  CO2 27 20 - 32 mmol/L   Calcium 9.5 8.6 - 10.3 mg/dL  Lipid panel  Result Value Ref Range   Cholesterol 183 <200 mg/dL   HDL 44 > OR = 40 mg/dL   Triglycerides 323 (H) <150 mg/dL   LDL Cholesterol (Calc) 102 (H) mg/dL (calc)   Total CHOL/HDL Ratio 4.2 <5.0 (calc)   Non-HDL Cholesterol (Calc) 139 (H) <130 mg/dL (calc)  TSH  Result Value Ref Range   TSH 1.44 0.40 - 4.50 mIU/L  CBC with Differential/Platelet  Result Value Ref Range   WBC 7.0 3.8 - 10.8 Thousand/uL   RBC 5.23 4.20 - 5.80 Million/uL   Hemoglobin 15.7 13.2 - 17.1 g/dL   HCT 55.7 32.2 - 02.5 %   MCV 89.7 80.0 - 100.0 fL   MCH 30.0 27.0 - 33.0 pg   MCHC 33.5 32.0 - 36.0 g/dL   RDW 42.7 06.2 - 37.6 %   Platelets 164 140 - 400 Thousand/uL   MPV 11.8 7.5 - 12.5 fL   Neutro Abs 4,389 1,500 - 7,800 cells/uL   Lymphs Abs 1,610 850 - 3,900 cells/uL   Absolute Monocytes 812 200 - 950 cells/uL   Eosinophils Absolute 161 15 - 500 cells/uL   Basophils Absolute 28 0 - 200 cells/uL   Neutrophils Relative % 62.7 %   Total Lymphocyte 23.0 %   Monocytes Relative 11.6 %   Eosinophils Relative 2.3 %   Basophils Relative 0.4 %  HIV Antibody (routine testing w rflx)  Result Value Ref Range   HIV 1&2 Ab, 4th Generation NON-REACTIVE NON-REACTI  Hepatitis C antibody  Result Value Ref Range   Hepatitis C Ab NON-REACTIVE NON-REACTI   SIGNAL TO CUT-OFF 0.01 <1.00       Assessment & Plan:   1. Essential hypertension Blood pressure good today on recheck after starting the  Zestoretic. Blood pressure checks at home have been slightly higher, more only systolic number, and continue to monitor at home as well and asked that he bring in his blood pressure cuff on his next follow-up.  Also ensuring that the cuff is not too tight recommended.  Did note the many things that can affect the top number as well. Discussed over time if he is losing a little more weight, exercising, and feeling any lightheadedness, to follow-up as may be able to remove the diuretic component from the medicine he is taking presently if his blood pressures are getting even better controlled. Also will recheck labs on follow-up  2. Mixed hyperlipidemia Reviewed the lipid panel results with him and the slightly elevated LDL and high triglyceride level. Dietary modifications recommended to help presently, and information provided in the AVS as well.  3. Anxiety with depression Ironic that his investments took a hit again yesterday in the stock market as it did shortly before our last visit.  He denies any marked increase in depression concerns, and continuing to monitor.  4. Class 1 obesity due to excess calories with serious comorbidity and body mass index (BMI) of 32.0 to 32.9 in adult Did lose approximately 10 pounds from last visit, and encouraged continued weight loss efforts.  Noted how that would help with his blood pressure control as well as his lipid panel. Continue to monitor.  5. Fatty liver This was a finding noted on his CT scan previously  6. Recurrent dislocation of right shoulder He notes no recurrent episodes since our last visit, and is careful with lifting in the gym presently and recommended  he continue to do so.  We will follow-up again in approximately 3 months time, and will check a BMP at that point and also to bring his cuff with him to help assess the accuracy of that and his outside blood pressure readings.  Follow-up sooner as needed        Jamelle Haring, MD 02/02/20 2:07 PM

## 2020-02-02 ENCOUNTER — Ambulatory Visit (INDEPENDENT_AMBULATORY_CARE_PROVIDER_SITE_OTHER): Payer: BC Managed Care – PPO | Admitting: Internal Medicine

## 2020-02-02 ENCOUNTER — Other Ambulatory Visit: Payer: Self-pay

## 2020-02-02 ENCOUNTER — Encounter: Payer: Self-pay | Admitting: Internal Medicine

## 2020-02-02 VITALS — BP 118/76 | HR 89 | Temp 98.3°F | Resp 16 | Ht 70.0 in | Wt 227.0 lb

## 2020-02-02 DIAGNOSIS — E6609 Other obesity due to excess calories: Secondary | ICD-10-CM | POA: Diagnosis not present

## 2020-02-02 DIAGNOSIS — I10 Essential (primary) hypertension: Secondary | ICD-10-CM

## 2020-02-02 DIAGNOSIS — K760 Fatty (change of) liver, not elsewhere classified: Secondary | ICD-10-CM

## 2020-02-02 DIAGNOSIS — F418 Other specified anxiety disorders: Secondary | ICD-10-CM | POA: Diagnosis not present

## 2020-02-02 DIAGNOSIS — E782 Mixed hyperlipidemia: Secondary | ICD-10-CM

## 2020-02-02 DIAGNOSIS — E66811 Obesity, class 1: Secondary | ICD-10-CM | POA: Insufficient documentation

## 2020-02-02 DIAGNOSIS — M24411 Recurrent dislocation, right shoulder: Secondary | ICD-10-CM

## 2020-02-02 DIAGNOSIS — Z6832 Body mass index (BMI) 32.0-32.9, adult: Secondary | ICD-10-CM

## 2020-02-02 NOTE — Patient Instructions (Signed)
High Triglycerides Eating Plan Triglycerides are a type of fat in the blood. High levels of triglycerides can increase your risk of heart disease and stroke. If your triglyceride levels are high, choosing the right foods can help lower your triglycerides and keep your heart healthy. Work with your health care provider or a diet and nutrition specialist (dietitian) to develop an eating plan that is right for you. What are tips for following this plan? General guidelines   Lose weight, if you are overweight. For most people, losing 5-10 lbs (2-5 kg) helps lower triglyceride levels. A weight-loss plan may include. ? 30 minutes of exercise at least 5 days a week. ? Reducing the amount of calories, sugar, and fat you eat.  Eat a wide variety of fresh fruits, vegetables, and whole grains. These foods are high in fiber.  Eat foods that contain healthy fats, such as fatty fish, nuts, seeds, and olive oil.  Avoid foods that are high in added sugar, added salt (sodium), saturated fat, and trans fat.  Avoid low-fiber, refined carbohydrates such as white bread, crackers, noodles, and white rice.  Avoid foods with partially hydrogenated oils (trans fats), such as fried foods or stick margarine.  Limit alcohol intake to no more than 1 drink a day for nonpregnant women and 2 drinks a day for men. One drink equals 12 oz of beer, 5 oz of wine, or 1 oz of hard liquor. Your health care provider may recommend that you drink less depending on your overall health. Reading food labels  Check food labels for the amount of saturated fat. Choose foods with no or very little saturated fat.  Check food labels for the amount of trans fat. Choose foods with no trans fat.  Check food labels for the amount of cholesterol. Choose foods low in cholesterol. Ask your dietitian how much cholesterol you should have each day.  Check food labels for the amount of sodium. Choose foods with less than 140 milligrams (mg) per  serving. Shopping  Buy dairy products labeled as nonfat (skim) or low-fat (1%).  Avoid buying processed or prepackaged foods. These are often high in added sugar, sodium, and fat. Cooking  Choose healthy fats when cooking, such as olive oil or canola oil.  Cook foods using lower fat methods, such as baking, broiling, boiling, or grilling.  Make your own sauces, dressings, and marinades when possible, instead of buying them. Store-bought sauces, dressings, and marinades are often high in sodium and sugar. Meal planning  Eat more home-cooked food and less restaurant, buffet, and fast food.  Eat fatty fish at least 2 times each week. Examples of fatty fish include salmon, trout, mackerel, tuna, and herring.  If you eat whole eggs, do not eat more than 3 egg yolks per week. What foods are recommended? The items listed may not be a complete list. Talk with your dietitian about what dietary choices are best for you. Grains Whole wheat or whole grain breads, crackers, cereals, and pasta. Unsweetened oatmeal. Bulgur. Barley. Quinoa. Brown rice. Whole wheat flour tortillas. Vegetables Fresh or frozen vegetables. Low-sodium canned vegetables. Fruits All fresh, canned (in natural juice), or frozen fruits. Meats and other protein foods Skinless chicken or turkey. Ground chicken or turkey. Lean cuts of pork, trimmed of fat. Fish and seafood, especially salmon, trout, and herring. Egg whites. Dried beans, peas, or lentils. Unsalted nuts or seeds. Unsalted canned beans. Natural peanut or almond butter. Dairy Low-fat dairy products. Skim or low-fat (1%) milk. Reduced fat (  2%) and low-sodium cheese. Low-fat ricotta cheese. Low-fat cottage cheese. Plain, low-fat yogurt. Fats and oils Tub margarine without trans fats. Light or reduced-fat mayonnaise. Light or reduced-fat salad dressings. Avocado. Safflower, olive, sunflower, soybean, and canola oils. What foods are not recommended? The items listed  may not be a complete list. Talk with your dietitian about what dietary choices are best for you. Grains White bread. White (regular) pasta. White rice. Cornbread. Bagels. Pastries. Crackers that contain trans fat. Vegetables Creamed or fried vegetables. Vegetables in a cheese sauce. Fruits Sweetened dried fruit. Canned fruit in syrup. Fruit juice. Meats and other protein foods Fatty cuts of meat. Ribs. Chicken wings. Bacon. Sausage. Bologna. Salami. Chitterlings. Fatback. Hot dogs. Bratwurst. Packaged lunch meats. Dairy Whole or reduced-fat (2%) milk. Half-and-half. Cream cheese. Full-fat or sweetened yogurt. Full-fat cheese. Nondairy creamers. Whipped toppings. Processed cheese or cheese spreads. Cheese curds. Beverages Alcohol. Sweetened drinks, such as soda, lemonade, fruit drinks, or punches. Fats and oils Butter. Stick margarine. Lard. Shortening. Ghee. Bacon fat. Tropical oils, such as coconut, palm kernel, or palm oils. Sweets and desserts Corn syrup. Sugars. Honey. Molasses. Candy. Jam and jelly. Syrup. Sweetened cereals. Cookies. Pies. Cakes. Donuts. Muffins. Ice cream. Condiments Store-bought sauces, dressings, and marinades that are high in sugar, such as ketchup and barbecue sauce. Summary  High levels of triglycerides can increase the risk of heart disease and stroke. Choosing the right foods can help lower your triglycerides.  Eat plenty of fresh fruits, vegetables, and whole grains. Choose low-fat dairy and lean meats. Eat fatty fish at least twice a week.  Avoid processed and prepackaged foods with added sugar, sodium, saturated fat, and trans fat.  If you need suggestions or have questions about what types of food are good for you, talk with your health care provider or a dietitian. This information is not intended to replace advice given to you by your health care provider. Make sure you discuss any questions you have with your health care provider. Document Revised:  08/15/2017 Document Reviewed: 11/05/2016 Elsevier Patient Education  2020 [employer]  

## 2020-05-02 NOTE — Progress Notes (Signed)
Patient ID: Jacob Moreno, male    DOB: [birth year], 39 y.o.   MRN: [id number]  PCP: Jamelle Haring, MD  Chief Complaint  Patient presents with  . Follow-up    discuss what could help him sleep, he takes tylenol PM    Subjective:   Jacob Moreno is a 39 y.o. male, presents to clinic with CC of the following:  Chief Complaint  Patient presents with  . Follow-up    discuss what could help him sleep, he takes tylenol PM    HPI:  Patient is a 39 year old male Last visit with me was 02/02/2020 Follows up today. 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  Noted he has an area on his back he wants me to look at, was more raised, and a friend tried to help drain it, is not painful, he is just concerned it may get bigger.  Essential hypertension:  Medication regimen - lisinopril/HCTZ 10/12.5 mg daily Not checking BP at home. Has been taking his medicine regularly. BP Readings from Last 3 Encounters:  05/04/20 124/80  02/02/20 118/76  01/12/20 (!) 190/108    He denied any chest pains, palp's, shortness of breath, increased leg swelling, increased headaches, or vision changes.   Obesity His weight was highest at 320 pounds in 2005, then lost a lot of weight and got down to 175 pounds in 2006. He then had an increased weight again, blaming it on increased THC use and laziness.  He noted previously he had been limited going to the gym with his plantar fasciitis, notes he tried the stretches, and just by getting more active it seems to have improved.  He noted when he was a more discomfort in the morning, he would get more active and it would go away.   Exercise-he has not been back in the gym too much recently, trying to get back more recent past, although this week not good  Diet-he has cut out cheerwine from his diet and has been watching his diet better in the recent past.   Wt Readings from Last 3 Encounters:   05/04/20 237 lb 12.8 oz (107.9 kg)  02/02/20 227 lb (103 kg)  01/12/20 237 lb 12.8 oz (107.9 kg)   Weight has increased again some.   Hyperlipidemia Not on  medications presently Dietary modifications recommended to help presently Lab Results  Component Value Date   CHOL 183 01/12/2020   HDL 44 01/12/2020   LDLCALC 102 (H) 01/12/2020   TRIG 257 (H) 01/12/2020   CHOLHDL 4.2 01/12/2020    Fatty Liver Was a finding noted on the CT above  kidney stone  symptoms remain resolved  H/oAnxiety/ Depression:Was seen in June 2019 with a history of depression noted at that visit, and also increasing symptoms after being fired from his job unexpectedly in November 2018.He was struggling with some sleepless nights, racing thoughts, some emotional instability and anxiety, with Zoloft prescribed at that time.He noted he did not feel good on that medicine, and stopped it.   He denies any increase in depressive symptoms of concern, with his PHQ-9 today reviewed as well He notes he is still doing the crypto currency investing has had more success in the recent past Does have some insomnia issues at times Takes ibuprofen pm for sleep, pretty routinely for last couple weeks.   Recurrent shoulder dislocation- He noticed the first time he did this  was in 1999, and states since that time it has happened about 20 times. Tried to clarify if it was subluxation versus true dislocation, and not entirely clear. The last episode was in February 2020.It has not happened since and he continues to be careful with lifting in the gym as a result.    Patient Active Problem List   Diagnosis Date Noted  . Mixed hyperlipidemia 02/02/2020  . Class 1 obesity due to excess calories with serious comorbidity and body mass index (BMI) of 32.0 to 32.9 in adult 02/02/2020  . Kidney stones 01/12/2020  . Essential hypertension 01/12/2020  . Fatty liver 01/12/2020  . Anxiety with depression 01/12/2020  .  Recurrent dislocation of right shoulder 01/12/2020      Current Outpatient Medications:  .  ibuprofen (ADVIL,MOTRIN) 800 MG tablet, Take 1 tablet (800 mg total) by mouth every 8 (eight) hours as needed for moderate pain (with food)., Disp: 20 tablet, Rfl: 0 .  lisinopril-hydrochlorothiazide (ZESTORETIC) 10-12.5 MG tablet, Take 1 tablet by mouth daily., Disp: 90 tablet, Rfl: 3   No Known Allergies   History reviewed. No pertinent surgical history.   History reviewed. No pertinent family history.   Social History   Tobacco Use  . Smoking status: Never Smoker  . Smokeless tobacco: Never Used  Substance Use Topics  . Alcohol use: No    With staff assistance, above reviewed with the patient today.  ROS: As per HPI, otherwise no specific complaints on a limited and focused system review   No results found for this or any previous visit (from the past 72 hour(s)).   PHQ2/9: Depression screen [facility] 2/9 05/04/2020 02/02/2020 01/12/2020  Decreased Interest 0 0 0  Down, Depressed, Hopeless 0 0 0  PHQ - 2 Score 0 0 0  Altered sleeping 0 1 1  Tired, decreased energy 0 1 1  Change in appetite 0 0 0  Feeling bad or failure about yourself  0 1 1  Trouble concentrating 0 0 0  Moving slowly or fidgety/restless 0 0 0  Suicidal thoughts 0 0 0  PHQ-9 Score 0 3 3  Difficult doing work/chores Not difficult at all Not difficult at all Not difficult at all   PHQ-2/9 Result is neg  Fall Risk: Fall Risk  05/04/2020 02/02/2020 01/12/2020  Falls in the past year? 0 0 0  Number falls in past yr: 0 0 0  Injury with Fall? 0 0 0      Objective:   Vitals:   05/04/20 1507  BP: 124/80  Pulse: (!) 104  Resp: 16  Temp: 98 F (36.7 C)  TempSrc: Temporal  SpO2: 98%  Weight: 237 lb 12.8 oz (107.9 kg)  Height: 5\' 10"  (1.778 m)    Body mass index is 34.12 kg/m.  Physical Exam   NAD, masked, pleasant HEENT - [place]/AT, sclera anicteric, positive glasses, PERRL, EOMI, conj - non-inj'ed,   pharynx clear Neck - supple, no adenopathy, no rigidity Car - RRR without m/g/r, heart rate on my exam was approximately 84 and regular, not tachycardic Pulm- RR and effort normal at rest, CTA without wheeze or rales Abd - soft, NT diffusely, mildly obese Back-a raised, slightly ovoid approximately nickel sized area was present in the upper middle back more left of the spine, was mildly fluctuant, had a linear scar centrally (likely from where his friend tried to drain this), was nontender, no erythema, Ext - no LE edema,  Neuro/psychiatric - affect was not flat, appropriate with  conversation             Alert and oriented             Grossly non-focal              Speech  normal   Results for orders placed or performed in visit on 01/12/20  BASIC METABOLIC PANEL WITH GFR  Result Value Ref Range   Glucose, Bld 78 65 - 99 mg/dL   BUN 13 7 - 25 mg/dL   Creat 4.40 1.02 - 7.25 mg/dL   GFR, Est Non African American 102 > OR = 60 mL/min/1.2m2   GFR, Est African American 118 > OR = 60 mL/min/1.37m2   BUN/Creatinine Ratio NOT APPLICABLE 6 - 22 (calc)   Sodium 142 135 - 146 mmol/L   Potassium 4.3 3.5 - 5.3 mmol/L   Chloride 106 98 - 110 mmol/L   CO2 27 20 - 32 mmol/L   Calcium 9.5 8.6 - 10.3 mg/dL  Lipid panel  Result Value Ref Range   Cholesterol 183 <200 mg/dL   HDL 44 > OR = 40 mg/dL   Triglycerides 366 (H) <150 mg/dL   LDL Cholesterol (Calc) 102 (H) mg/dL (calc)   Total CHOL/HDL Ratio 4.2 <5.0 (calc)   Non-HDL Cholesterol (Calc) 139 (H) <130 mg/dL (calc)  TSH  Result Value Ref Range   TSH 1.44 0.40 - 4.50 mIU/L  CBC with Differential/Platelet  Result Value Ref Range   WBC 7.0 3.8 - 10.8 Thousand/uL   RBC 5.23 4.20 - 5.80 Million/uL   Hemoglobin 15.7 13.2 - 17.1 g/dL   HCT 44.0 38 - 50 %   MCV 89.7 80.0 - 100.0 fL   MCH 30.0 27.0 - 33.0 pg   MCHC 33.5 32.0 - 36.0 g/dL   RDW 34.7 42.5 - 95.6 %   Platelets 164 140 - 400 Thousand/uL   MPV 11.8 7.5 - 12.5 fL   Neutro Abs 4,389  1,500 - 7,800 cells/uL   Lymphs Abs 1,610 850 - 3,900 cells/uL   Absolute Monocytes 812 200 - 950 cells/uL   Eosinophils Absolute 161 15 - 500 cells/uL   Basophils Absolute 28 0 - 200 cells/uL   Neutrophils Relative % 62.7 %   Total Lymphocyte 23.0 %   Monocytes Relative 11.6 %   Eosinophils Relative 2.3 %   Basophils Relative 0.4 %  HIV Antibody (routine testing w rflx)  Result Value Ref Range   HIV 1&2 Ab, 4th Generation NON-REACTIVE NON-REACTI  Hepatitis C antibody  Result Value Ref Range   Hepatitis C Ab NON-REACTIVE NON-REACTI   SIGNAL TO CUT-OFF 0.01 <1.00   Last labs reviewed    Assessment & Plan:    1. Essential hypertension Blood pressure remains better controlled on Zestoretic, with recent DOT evaluation noting a good blood pressure reading. Last BMP 3 months ago was good. Agreed to wait until follow-up at the end of the year before rechecking the labs again. Continue the medication presently  2. Mixed hyperlipidemia Last lipids reviewed, with the triglycerides high and the LDL just above desired, and discussed importance of dietary modifications to help with information provided in the AVS on this Also increasing physical activity levels can be helpful and encouraged.  3. Anxiety with depression Has remained better, and actually having some success with his cryptocurrency investing presently likely helping. Continue to monitor  4. Class 1 obesity due to excess calories with serious comorbidity and body mass index (BMI) of 32.0 to 32.9 in  adult Discussed today the importance of healthy weight maintenance, and working hard in trying to keep his weight stable and perhaps losing a little weight again, he noted his goal is to lose some pounds here in the near future. Encouraged the dietary modifications and increasing physical activity to help with healthy weight maintenance.  5. Fatty liver Noted this was a finding in his past, and will continue to monitor  6.  Insomnia, unspecified type He noted he has been struggling more with this in the past couple weeks, and has been taking an ibuprofen PM product to help. Recommended to him to use a Benadryl product without the ibuprofen at nighttime, up to 50 mg nightly as needed, as noted concerns with chronic NSAID use and his blood pressure.  Also can bother the kidneys and cause some stomach issues over time We will assess his response with the Benadryl product.  7. Benign skin lesion of lower back Noted the lesion on his back was likely a cyst, and the importance of not letting these get infected over time. Noted if trying to drain, often recur, and may have some scar tissue component presently as well. If increasing over time, can follow-up, and sometimes will ask a general surgeon to help shell the cyst out Can try some warm compresses in the short-term, sometimes that can help these drain, sometimes internally and help it lessen in size.  8. Plantar fasciitis Continue with the stretches recommended, and being more active can be helpful, and if more problematic will follow up. Also exercising and appropriate footwear emphasized today.   We will follow-up in approximately 4 months time, at the end of the year, sooner as needed will likely check a BMP again at that time,    Jamelle Haring, MD 05/04/20 3:12 PM

## 2020-05-04 ENCOUNTER — Other Ambulatory Visit: Payer: Self-pay

## 2020-05-04 ENCOUNTER — Encounter: Payer: Self-pay | Admitting: Internal Medicine

## 2020-05-04 ENCOUNTER — Ambulatory Visit (INDEPENDENT_AMBULATORY_CARE_PROVIDER_SITE_OTHER): Payer: BC Managed Care – PPO | Admitting: Internal Medicine

## 2020-05-04 VITALS — BP 124/80 | HR 104 | Temp 98.0°F | Resp 16 | Ht 70.0 in | Wt 237.8 lb

## 2020-05-04 DIAGNOSIS — E782 Mixed hyperlipidemia: Secondary | ICD-10-CM | POA: Diagnosis not present

## 2020-05-04 DIAGNOSIS — K760 Fatty (change of) liver, not elsewhere classified: Secondary | ICD-10-CM

## 2020-05-04 DIAGNOSIS — I10 Essential (primary) hypertension: Secondary | ICD-10-CM | POA: Diagnosis not present

## 2020-05-04 DIAGNOSIS — G4700 Insomnia, unspecified: Secondary | ICD-10-CM | POA: Insufficient documentation

## 2020-05-04 DIAGNOSIS — Z6832 Body mass index (BMI) 32.0-32.9, adult: Secondary | ICD-10-CM

## 2020-05-04 DIAGNOSIS — E6609 Other obesity due to excess calories: Secondary | ICD-10-CM

## 2020-05-04 DIAGNOSIS — L989 Disorder of the skin and subcutaneous tissue, unspecified: Secondary | ICD-10-CM | POA: Insufficient documentation

## 2020-05-04 DIAGNOSIS — M722 Plantar fascial fibromatosis: Secondary | ICD-10-CM

## 2020-05-04 DIAGNOSIS — F418 Other specified anxiety disorders: Secondary | ICD-10-CM | POA: Diagnosis not present

## 2020-05-04 NOTE — Patient Instructions (Signed)
High Triglycerides Eating Plan Triglycerides are a type of fat in the blood. High levels of triglycerides can increase your risk of heart disease and stroke. If your triglyceride levels are high, choosing the right foods can help lower your triglycerides and keep your heart healthy. Work with your health care provider or a diet and nutrition specialist (dietitian) to develop an eating plan that is right for you. What are tips for following this plan? General guidelines   Lose weight, if you are overweight. For most people, losing 5-10 lbs (2-5 kg) helps lower triglyceride levels. A weight-loss plan may include. ? 30 minutes of exercise at least 5 days a week. ? Reducing the amount of calories, sugar, and fat you eat.  Eat a wide variety of fresh fruits, vegetables, and whole grains. These foods are high in fiber.  Eat foods that contain healthy fats, such as fatty fish, nuts, seeds, and olive oil.  Avoid foods that are high in added sugar, added salt (sodium), saturated fat, and trans fat.  Avoid low-fiber, refined carbohydrates such as white bread, crackers, noodles, and white rice.  Avoid foods with partially hydrogenated oils (trans fats), such as fried foods or stick margarine.  Limit alcohol intake to no more than 1 drink a day for nonpregnant women and 2 drinks a day for men. One drink equals 12 oz of beer, 5 oz of wine, or 1 oz of hard liquor. Your health care provider may recommend that you drink less depending on your overall health. Reading food labels  Check food labels for the amount of saturated fat. Choose foods with no or very little saturated fat.  Check food labels for the amount of trans fat. Choose foods with no trans fat.  Check food labels for the amount of cholesterol. Choose foods low in cholesterol. Ask your dietitian how much cholesterol you should have each day.  Check food labels for the amount of sodium. Choose foods with less than 140 milligrams (mg) per  serving. Shopping  Buy dairy products labeled as nonfat (skim) or low-fat (1%).  Avoid buying processed or prepackaged foods. These are often high in added sugar, sodium, and fat. Cooking  Choose healthy fats when cooking, such as olive oil or canola oil.  Cook foods using lower fat methods, such as baking, broiling, boiling, or grilling.  Make your own sauces, dressings, and marinades when possible, instead of buying them. Store-bought sauces, dressings, and marinades are often high in sodium and sugar. Meal planning  Eat more home-cooked food and less restaurant, buffet, and fast food.  Eat fatty fish at least 2 times each week. Examples of fatty fish include salmon, trout, mackerel, tuna, and herring.  If you eat whole eggs, do not eat more than 3 egg yolks per week. What foods are recommended? The items listed may not be a complete list. Talk with your dietitian about what dietary choices are best for you. Grains Whole wheat or whole grain breads, crackers, cereals, and pasta. Unsweetened oatmeal. Bulgur. Barley. Quinoa. Brown rice. Whole wheat flour tortillas. Vegetables Fresh or frozen vegetables. Low-sodium canned vegetables. Fruits All fresh, canned (in natural juice), or frozen fruits. Meats and other protein foods Skinless chicken or turkey. Ground chicken or turkey. Lean cuts of pork, trimmed of fat. Fish and seafood, especially salmon, trout, and herring. Egg whites. Dried beans, peas, or lentils. Unsalted nuts or seeds. Unsalted canned beans. Natural peanut or almond butter. Dairy Low-fat dairy products. Skim or low-fat (1%) milk. Reduced fat (  2%) and low-sodium cheese. Low-fat ricotta cheese. Low-fat cottage cheese. Plain, low-fat yogurt. Fats and oils Tub margarine without trans fats. Light or reduced-fat mayonnaise. Light or reduced-fat salad dressings. Avocado. Safflower, olive, sunflower, soybean, and canola oils. What foods are not recommended? The items listed  may not be a complete list. Talk with your dietitian about what dietary choices are best for you. Grains White bread. White (regular) pasta. White rice. Cornbread. Bagels. Pastries. Crackers that contain trans fat. Vegetables Creamed or fried vegetables. Vegetables in a cheese sauce. Fruits Sweetened dried fruit. Canned fruit in syrup. Fruit juice. Meats and other protein foods Fatty cuts of meat. Ribs. Chicken wings. Bacon. Sausage. Bologna. Salami. Chitterlings. Fatback. Hot dogs. Bratwurst. Packaged lunch meats. Dairy Whole or reduced-fat (2%) milk. Half-and-half. Cream cheese. Full-fat or sweetened yogurt. Full-fat cheese. Nondairy creamers. Whipped toppings. Processed cheese or cheese spreads. Cheese curds. Beverages Alcohol. Sweetened drinks, such as soda, lemonade, fruit drinks, or punches. Fats and oils Butter. Stick margarine. Lard. Shortening. Ghee. Bacon fat. Tropical oils, such as coconut, palm kernel, or palm oils. Sweets and desserts Corn syrup. Sugars. Honey. Molasses. Candy. Jam and jelly. Syrup. Sweetened cereals. Cookies. Pies. Cakes. Donuts. Muffins. Ice cream. Condiments Store-bought sauces, dressings, and marinades that are high in sugar, such as ketchup and barbecue sauce. Summary  High levels of triglycerides can increase the risk of heart disease and stroke. Choosing the right foods can help lower your triglycerides.  Eat plenty of fresh fruits, vegetables, and whole grains. Choose low-fat dairy and lean meats. Eat fatty fish at least twice a week.  Avoid processed and prepackaged foods with added sugar, sodium, saturated fat, and trans fat.  If you need suggestions or have questions about what types of food are good for you, talk with your health care provider or a dietitian. This information is not intended to replace advice given to you by your health care provider. Make sure you discuss any questions you have with your health care provider. Document Revised:  08/15/2017 Document Reviewed: 11/05/2016 Elsevier Patient Education  2020 [employer]  

## 2020-08-31 NOTE — Progress Notes (Deleted)
Patient is a 39 year old male Last visit with me was 05/04/2020 Follows up today.   Essential hypertension: Medication regimen - lisinopril/HCTZ 10/12.5 mg daily Not checking BP at home. Has been taking his medicine regularly. BP Readings from Last 3 Encounters:  05/04/20 124/80  02/02/20 118/76  01/12/20 (!) 190/108   He denied any chest pains, palp's, shortness of breath, increased leg swelling,increasedheadaches, or vision changes.   Obesity His weight was highest at 320 pounds in 2005, then lost a lot of weight and got down to 175 pounds in 2006. He then had an increased weight again, blaming it on increased THC use and laziness.  He noted previously he had been limited going to the gym with his plantar fasciitis, notes he tried the stretches, and just by getting more active it seems to have improved.  He noted more discomfort in the morning, he would get more active and it would go away.   Exercise-he has not been back in the gym too much recently, trying to get back more recent past, although this week not good  Diet-he has cut out cheerwine from his diet and has been watching his diet better in the recent past.  Wt Readings from Last 3 Encounters:  05/04/20 237 lb 12.8 oz (107.9 kg)  02/02/20 227 lb (103 kg)  01/12/20 237 lb 12.8 oz (107.9 kg)   Weight has increased again some.   Hyperlipidemia Not on medications presently Dietary modifications recommended to help presently Lab Results  Component Value Date   CHOL 183 01/12/2020   HDL 44 01/12/2020   LDLCALC 102 (H) 01/12/2020   TRIG 257 (H) 01/12/2020   CHOLHDL 4.2 01/12/2020    Fatty Liver Was a finding noted on the CT above  kidney stone  symptoms remainresolved  H/oAnxiety/ Depression:Was seen in June 2019 with a history of depression noted at that visit, and also increasing symptoms after being fired from his job unexpectedly in November 2018.He was struggling with some sleepless  nights, racing thoughts, some emotional instability and anxiety, with Zoloft prescribed at that time.He noted he did not feel good on that medicine, and stopped it.  He denies any increase in depressive symptoms of concern, with his PHQ-9 today reviewed as well He notes he is still doing the crypto currency investing has had more success in the recent past Does have some insomnia issues at times Takes ibuprofen pm for sleep, pretty routinely for last couple weeks.   Recurrent shoulder dislocation- He noticed the first time he did this was in 1999, and states since that time it has happened about 20 times. Tried to clarify if it was subluxation versus true dislocation, and not entirely clear. The last episode was in February 2020.It has not happened sinceand he continues to be careful with lifting in the gym as a result.  NAD, masked,pleasant HEENT - /AT, sclera anicteric, positive glasses, PERRL, EOMI, conj - non-inj'ed, pharynx clear Neck - supple, no adenopathy, no rigidity Car - RRR without m/g/r, heart rate on my exam was approximately 84 and regular, not tachycardic Pulm- RR and effort normal at rest, CTA without wheeze or rales Abd - soft, NTdiffusely, mildly obese Back-a raised, slightly ovoid approximately nickel sized area was present in the upper middle back more left of the spine, was mildly fluctuant, had a linear scar centrally (likely from where his friend tried to drain this), was nontender, no erythema, Ext - no LE edema,  Neuro/psychiatric - affect was not flat, appropriate with  conversation Alert and oriented Grossly non-focal  Speech normal    1. Essential hypertension Blood pressure remains better controlled on Zestoretic, with recent DOT evaluation noting a good blood pressure reading. Last BMP 3 months ago was good. Agreed to wait until follow-up at the end of the year before rechecking the labs again. Continue  the medication presently  2. Mixed hyperlipidemia Last lipids reviewed, with the triglycerides high and the LDL just above desired, and discussed importance of dietary modifications to help with information provided in the AVS on this Also increasing physical activity levels can be helpful and encouraged.  3. Anxiety with depression Has remained better, and actually having some success with his cryptocurrency investing presently likely helping. Continue to monitor  4. Class 1 obesity due to excess calories with serious comorbidity and body mass index (BMI) of 32.0 to 32.9 in adult Discussed today the importance of healthy weight maintenance, and working hard in trying to keep his weight stable and perhaps losing a little weight again, he noted his goal is to lose some pounds here in the near future. Encouraged the dietary modifications and increasing physical activity to help with healthy weight maintenance.  5. Fatty liver Noted this was a finding in his past, and will continue to monitor  6. Insomnia, unspecified type He noted he has been struggling more with this in the past couple weeks, and has been taking an ibuprofen PM product to help. Recommended to him to use a Benadryl product without the ibuprofen at nighttime, up to 50 mg nightly as needed, as noted concerns with chronic NSAID use and his blood pressure.  Also can bother the kidneys and cause some stomach issues over time We will assess his response with the Benadryl product.  7. Benign skin lesion of lower back Noted the lesion on his back was likely a cyst, and the importance of not letting these get infected over time. Noted if trying to drain, often recur, and may have some scar tissue component presently as well. If increasing over time, can follow-up, and sometimes will ask a general surgeon to help shell the cyst out Can try some warm compresses in the short-term, sometimes that can help these drain, sometimes  internally and help it lessen in size.  8. Plantar fasciitis Continue with the stretches recommended, and being more active can be helpful, and if more problematic will follow up. Also exercising and appropriate footwear emphasized today.   We will hold off on checking labs again until his follow-up visit in 3 to 4 months time, can follow-up sooner as needed. He is aware that the follow-up will be with a different provider as I will be leaving this practice sooner than that planned follow-up

## 2020-09-04 ENCOUNTER — Ambulatory Visit: Payer: BC Managed Care – PPO | Admitting: Internal Medicine

## 2020-09-20 ENCOUNTER — Other Ambulatory Visit: Payer: Self-pay

## 2020-09-20 ENCOUNTER — Ambulatory Visit
Admission: EM | Admit: 2020-09-20 | Discharge: 2020-09-20 | Disposition: A | Discharge status: Home / Self Care | Payer: BC Managed Care – PPO | Attending: Sports Medicine | Admitting: Sports Medicine

## 2020-09-20 DIAGNOSIS — M25571 Pain in right ankle and joints of right foot: Secondary | ICD-10-CM

## 2020-09-20 DIAGNOSIS — M25471 Effusion, right ankle: Secondary | ICD-10-CM

## 2020-09-20 DIAGNOSIS — M7661 Achilles tendinitis, right leg: Secondary | ICD-10-CM

## 2020-09-20 MED ORDER — PREDNISONE 10 MG (21) PO TBPK
ORAL_TABLET | Freq: Every day | ORAL | 0 refills | Status: DC
Start: 2020-09-20 — End: 2021-05-28

## 2020-09-20 NOTE — ED Triage Notes (Signed)
Patient complains of right ankle pain and swelling that started today without injury.

## 2020-09-20 NOTE — ED Provider Notes (Signed)
[facility]    CSN: [id number] Arrival date & time: 09/20/20  1807      History   Chief Complaint Chief Complaint  Patient presents with  . Ankle Pain    [phone]; right    HPI Jacob Moreno is a 40 y.o. male.   Patient pleasant 40 year old male who presents for evaluation of right posterior ankle pain and swelling.  He says he has had a symptoms for about 5 days.  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  He said that if he goes to the gym fairly regularly he does not have problems but if he does not go to the gym his ankle flares up.  He denies any accidents trauma or falls.  He has no history of gout.  No fever shakes chills.  No nausea vomiting or diarrhea.  No Covid symptoms.  He has been vaccinated against Covid x2 and influenza x1.  He reports he has had chronic issues with this ankle and comes in today for initial urgent care evaluation.  No red flag signs or symptoms.     Past Medical History:  Diagnosis Date  . Hypertension     Patient Active Problem List   Diagnosis Date Noted  . Plantar fasciitis 05/04/2020  . Insomnia 05/04/2020  . Benign skin lesion of lower back 05/04/2020  . Mixed hyperlipidemia 02/02/2020  . Class 1 obesity due to excess calories with serious comorbidity and body mass index (BMI) of 32.0 to 32.9 in adult 02/02/2020  . Kidney stones 01/12/2020  . Essential hypertension 01/12/2020  . Fatty liver 01/12/2020  . Anxiety with depression 01/12/2020  . 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 NO PAST SURGERIES         Home Medications    Prior to Admission medications   Medication Sig Start Date End Date Taking? Authorizing Provider  lisinopril-hydrochlorothiazide (ZESTORETIC) 10-12.5 MG tablet Take 1 tablet by mouth daily. 01/12/20  Yes Towanda Malkin, MD  predniSONE (STERAPRED UNI-PAK 21 TAB) 10 MG (21) TBPK tablet  Take by mouth daily. Take 6 tabs by mouth daily  for 2 days, then 5 tabs for 2 days, then 4 tabs for 2 days, then 3 tabs for 2 days, 2 tabs for 2 days, then 1 tab by mouth daily for 2 days 09/20/20  Yes Verda Cumins, MD  ibuprofen (ADVIL,MOTRIN) 800 MG tablet Take 1 tablet (800 mg total) by mouth every 8 (eight) hours as needed for moderate pain (with food). 08/03/15   Eula Listen, MD    Family History Family History  Problem Relation Age of Onset  . Healthy Mother   . Healthy Father     Social History Social History   Tobacco Use  . Smoking status: Never Smoker  . Smokeless tobacco: Never Used  Vaping Use  . Vaping Use: Never used  Substance Use Topics  . Alcohol use: No  . Drug use: Never     Allergies   Patient has no known allergies.   Review of Systems Review of Systems  Constitutional: Negative for activity change, appetite change, chills, fatigue and fever.  HENT: Negative.   Eyes: Negative.   Respiratory: Negative.   Cardiovascular: Negative.   Gastrointestinal: Negative.   Musculoskeletal: Positive for arthralgias, gait problem and joint swelling. Negative for back pain, neck pain and neck stiffness.  Skin: Negative for color change, pallor, rash  and wound.  Neurological: Negative for dizziness, light-headedness, numbness and headaches.  All other systems reviewed and are negative.    Physical Exam Triage Vital Signs ED Triage Vitals  Enc Vitals Group     BP 09/20/20 2104 135/75     Pulse Rate 09/20/20 2104 72     Resp 09/20/20 2104 18     Temp 09/20/20 2104 98.6 F (37 C)     Temp Source 09/20/20 2104 Oral     SpO2 09/20/20 2104 100 %     Weight 09/20/20 1924 250 lb (113.4 kg)     Height 09/20/20 1924 5\' 10"  (1.778 m)     Head Circumference --      Peak Flow --      Pain Score 09/20/20 1924 7     Pain Loc --      Pain Edu? --      Excl. in GC? --    No data found.  Updated Vital Signs BP 135/75 (BP Location: Right Arm)   Pulse  72   Temp 98.6 F (37 C) (Oral)   Resp 18   Ht 5\' 10"  (1.778 m)   Wt 113.4 kg   SpO2 100%   BMI 35.87 kg/m   Visual Acuity Right Eye Distance:   Left Eye Distance:   Bilateral Distance:    Right Eye Near:   Left Eye Near:    Bilateral Near:     Physical Exam Vitals and nursing note reviewed.  Constitutional:      General: He is not in acute distress.    Appearance: Normal appearance. He is not ill-appearing or toxic-appearing.     Comments: He is uncomfortable throughout the examination.  He has an antalgic gait pattern.  Musculoskeletal:     Comments: Left foot and ankle is normal to inspection palpation range of motion special test.  Right foot and ankle no obvious bony abnormality ecchymosis erythema.  He does have soft tissue swelling around the entire ankle but mostly concentrated posteriorly over the Achilles tendon.  It does extend around into the medial and lateral aspects of his ankle.  He is very tender over the Achilles tendon.  Homans and Thompson's test are negative.  There is no step deformity.  There is no evidence of any retrocalcaneal bursitis.  No tenderness over the plantar fascia.  He has no real tenderness over the medial or lateral ligaments.  He has definite decreased range of motion in all planes secondary to pain and swelling.  He has good strength with resisted dorsi and plantar flexion as well as inversion and eversion.  No evidence of any tendon retraction.  The midfoot and the forefoot are within normal limits.  Normal sensation 2+ pulses distally.  Skin:    General: Skin is warm and dry.     Capillary Refill: Capillary refill takes less than 2 seconds.     Coloration: Skin is not jaundiced.     Findings: No bruising, erythema, lesion or rash.  Neurological:     General: No focal deficit present.     Mental Status: He is alert and oriented to person, place, and time.      UC Treatments / Results  Labs (all labs ordered are listed, but only  abnormal results are displayed) Labs Reviewed - No data to display  EKG   Radiology No results found.  Procedures Procedures (including critical care time)  Medications Ordered in UC Medications - No data to display  Initial  Impression / Assessment and Plan / UC Course  I have reviewed the triage vital signs and the nursing notes.  Pertinent labs & imaging results that were available during my care of the patient were reviewed by me and considered in my medical decision making (see chart for details).  Clinical impression: Acute flare of some chronic right foot and ankle issues.  Seems consistent with Achilles tendinopathy.  Does have some significant swelling but no step deformity.  Treatment plan 1.  The findings and treatment plan were discussed in detail with the patient.  Patient was in agreement. 2.  We did discuss doing an x-ray but he is self pay so we deferred on that.  Without trauma that was fine. 3.  I discussed in detail my findings.  I have recommended just supportive care for now which includes rest, ice, elevation, and compression.  I have asked him to purchase some compression stockings just to help with mobilizing some of the fluid. 4.  Gave him a Sterapred Dosepak just to help with the acute inflammation.  He should avoid any NSAIDs while taking this.  He can use Tylenol. 5.  I do not think that it would be prudent for him to work tomorrow.  I gave him a work note allowing him to return to work on Friday.  He should ice and elevate and start the steroids tomorrow. 6.  Once he gets insurance he should reestablish care with his primary care physician.  He will probably need a referral for some physical therapy.  Also may need a referral to orthopedic sports medicine. 7.  We will discharge from care at this time we will follow up with [place] as needed.     Final Clinical Impressions(s) / UC Diagnoses   Final diagnoses:  Pain and swelling of right ankle  Achilles  tendinitis, right leg     Discharge Instructions     See patient instructions     ED Prescriptions    Medication Sig Dispense Auth. Provider   predniSONE (STERAPRED UNI-PAK 21 TAB) 10 MG (21) TBPK tablet Take by mouth daily. Take 6 tabs by mouth daily  for 2 days, then 5 tabs for 2 days, then 4 tabs for 2 days, then 3 tabs for 2 days, 2 tabs for 2 days, then 1 tab by mouth daily for 2 days 42 tablet Delton See, MD     PDMP not reviewed this encounter.   Delton See, MD 09/20/20 [phone]

## 2020-09-20 NOTE — Discharge Instructions (Addendum)
See patient instructions

## 2021-01-17 IMAGING — CT CT RENAL STONE PROTOCOL
2 of 4 series · 16 of 46 positions shown, 18 images · non-contrast
Comparison: None.

CLINICAL DATA: Onset left flank pain 09/17/2019.

EXAM:
CT ABDOMEN AND PELVIS WITHOUT CONTRAST
TECHNIQUE: Multidetector CT imaging of the abdomen and pelvis was performed
following the standard protocol without IV contrast.

[Series 2: stone full standard · axial · 0.83mm/px · z∈[-674,-199]mm · 13 of 105 slices shown, 15 images]
[im 5/105  soft-tissue]
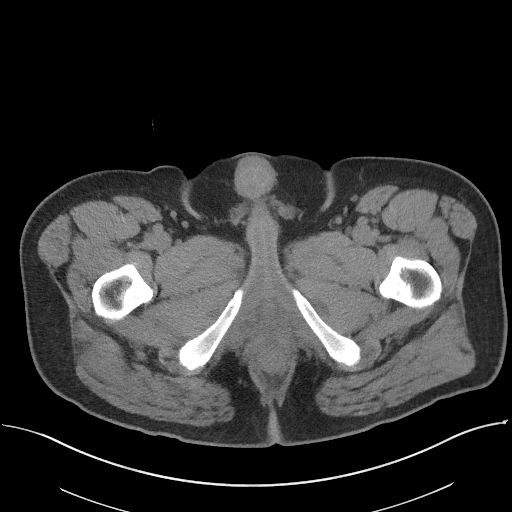
[im 5/105  bone]
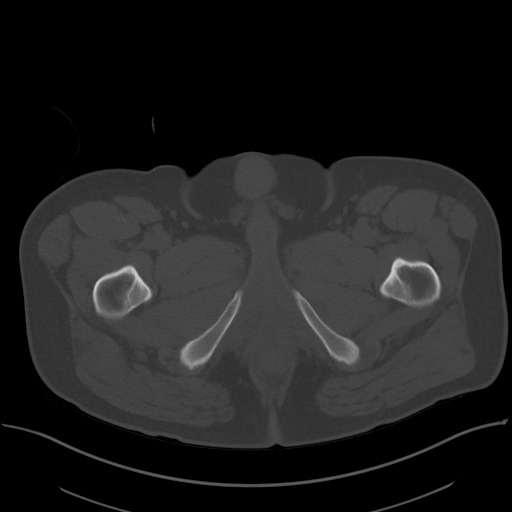
[im 14/105  soft-tissue]
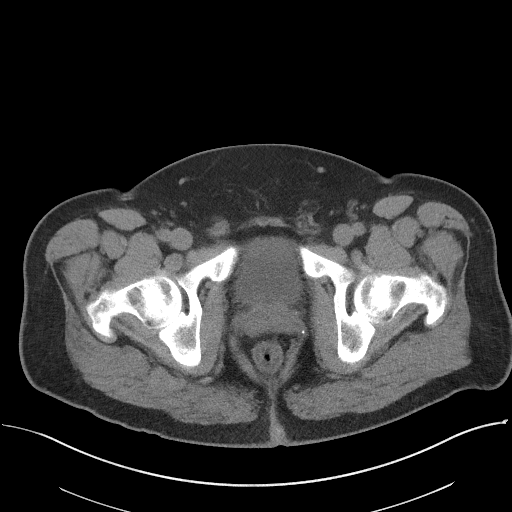
[im 22/105  soft-tissue]
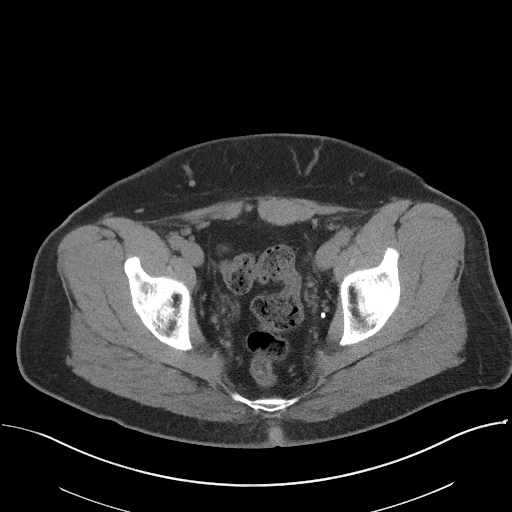
[im 31/105  soft-tissue]
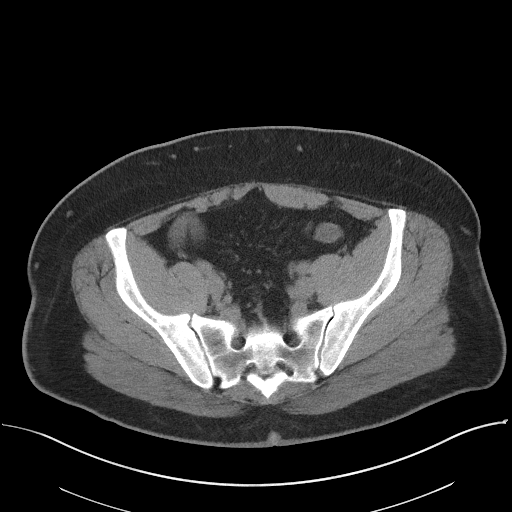
[im 35/105  soft-tissue]
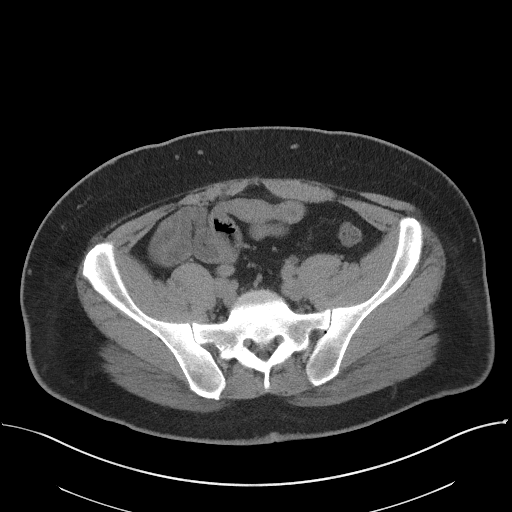
[im 44/105  soft-tissue]
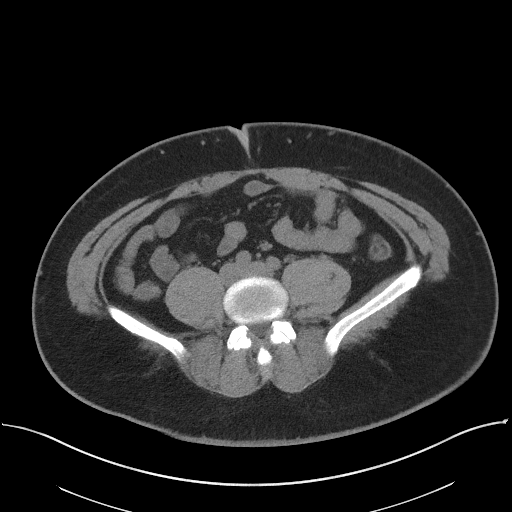
[im 53/105  soft-tissue]
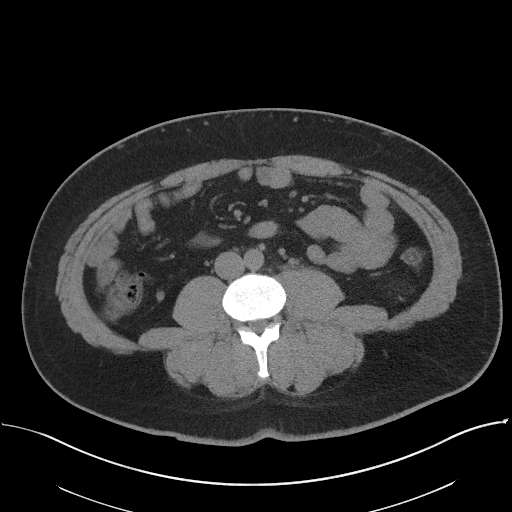
[im 61/105  soft-tissue]
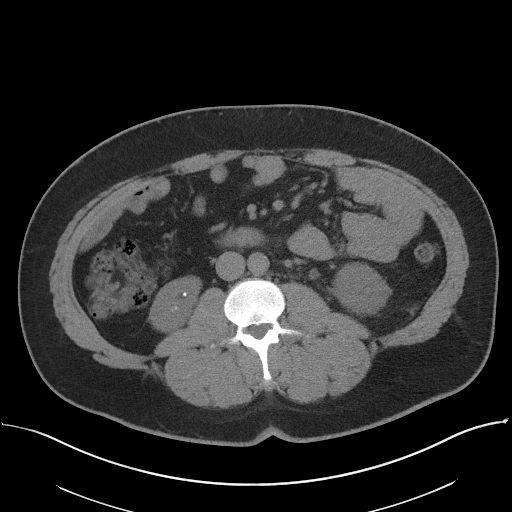
[im 70/105  soft-tissue]
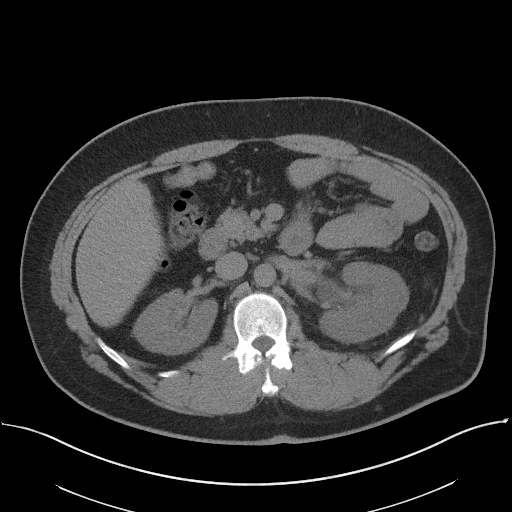
[im 70/105  bone]
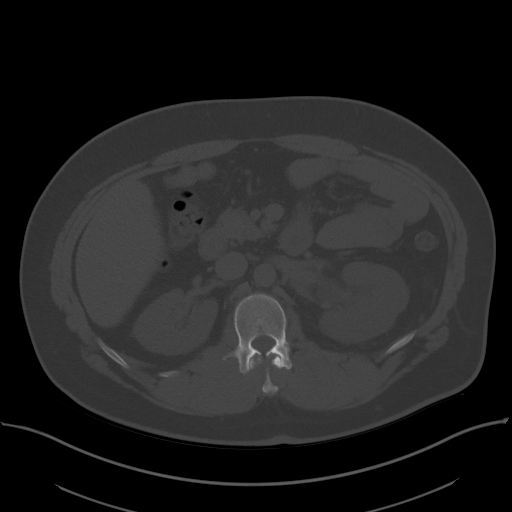
[im 74/105  soft-tissue]
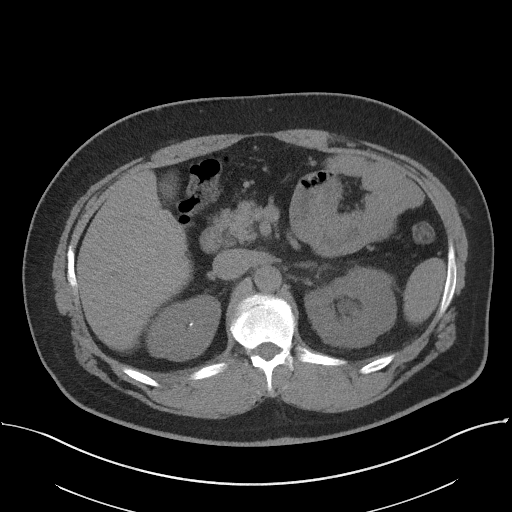
[im 83/105  soft-tissue]
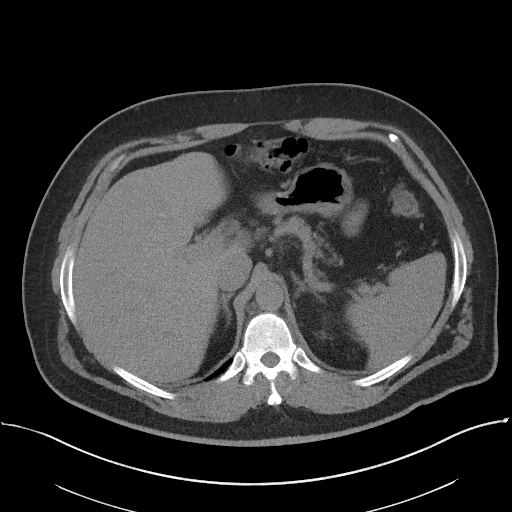
[im 92/105  soft-tissue]
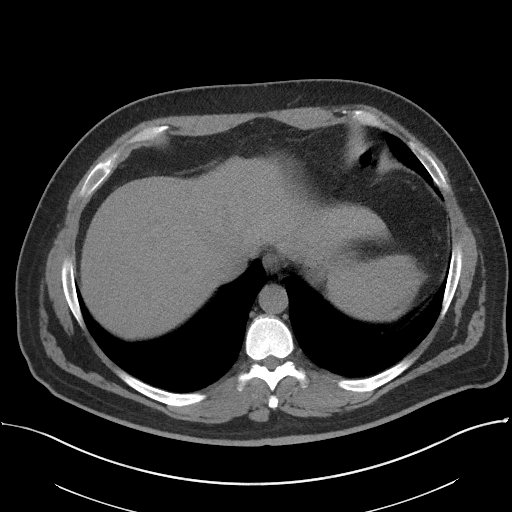
[im 100/105  soft-tissue]
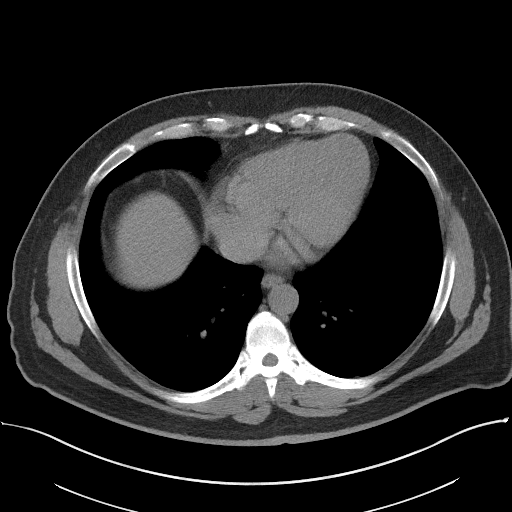

[Series 5: coronal · coronal · 0.81mm/px · 3 of 146 slices shown]
[im 49/146  soft-tissue]
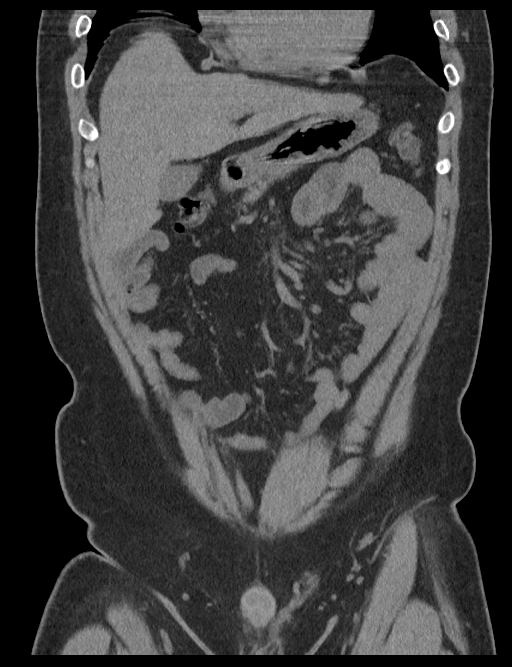
[im 65/146  soft-tissue]
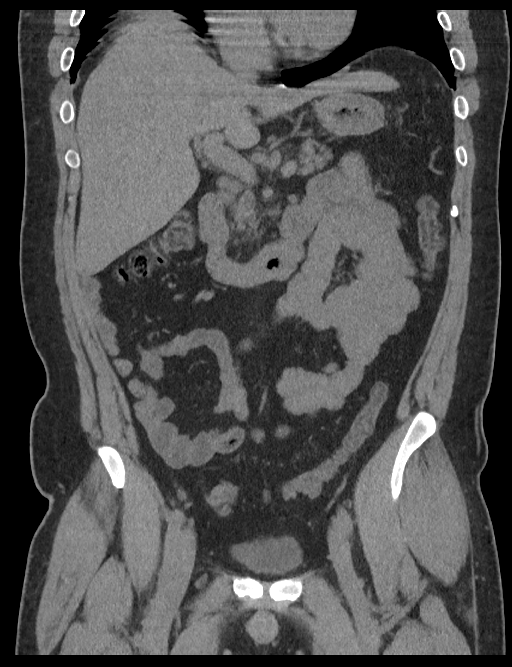
[im 81/146  soft-tissue]
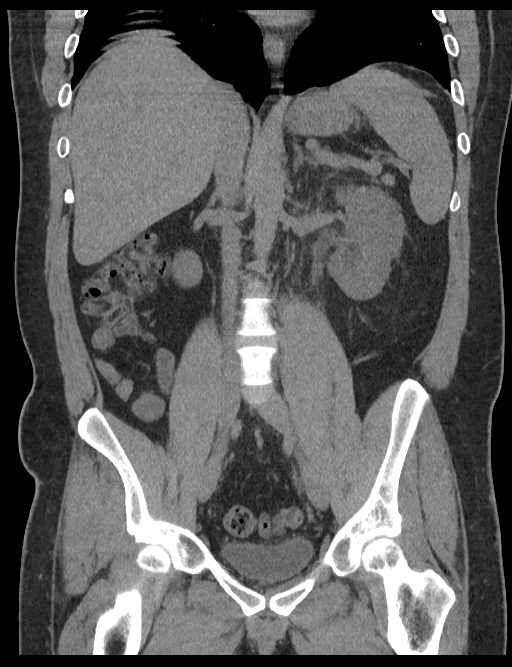

[16 of 46 positions shown; findings below may reference images not displayed]

FINDINGS: Lower chest: Lung bases are clear. No pleural or pericardial
effusion.

Hepatobiliary: A few tiny gallstones are seen but there is no
evidence of cholecystitis. No focal liver lesion is identified. The
liver is mildly low attenuating consistent with fatty infiltration.
The biliary tree is normal.

Pancreas: Unremarkable. No pancreatic ductal dilatation or
surrounding inflammatory changes.

Spleen: Normal in size without focal abnormality.

Adrenals/Urinary Tract: The adrenal glands appear normal. The
patient has multiple small nonobstructing stones in both kidneys,
more numerous on the right. There is mild to moderate left
hydronephrosis with stranding about the left kidney and ureter due
to a 0.3 cm distal left ureteral stone. The stone is just above the
level of the left hip. No right hydronephrosis or ureteral stone. No
urinary bladder stones.

Stomach/Bowel: Stomach is within normal limits. Appendix appears
normal. No evidence of bowel wall thickening, distention, or
inflammatory changes.

Vascular/Lymphatic: No significant vascular findings are present. No
enlarged abdominal or pelvic lymph nodes.

Reproductive: Prostate is unremarkable.

Other: None.

Musculoskeletal: Negative.
IMPRESSION: 1. Mild to moderate left hydronephrosis due to a 0.3 cm distal left
ureteral stone. The stone is just above the level of the left hip.
2. Multiple small nonobstructing stones in both kidneys, more
numerous on the right.
3. Fatty infiltration of the liver.
4. Gallstones without evidence of cholecystitis.

## 2021-05-28 ENCOUNTER — Ambulatory Visit: Payer: Self-pay | Admitting: Family Medicine

## 2021-05-28 NOTE — Progress Notes (Deleted)
    SUBJECTIVE:   CHIEF COMPLAINT / HPI:   Hypertension: - Medications: lisinopril-HCTZ - Compliance: *** - Checking BP at home: *** - Denies any SOB, CP, vision changes, LE edema, medication SEs, or symptoms of hypotension - Diet: *** - Exercise: ***  OBESITY - Meds: none - Complications of obesity: HTN, HLD - Peak weight: *** - Weight loss goal: *** - Weight loss to date: *** - Diet: *** - Exercise: ***     OBJECTIVE:   There were no vitals taken for this visit.  ***  ASSESSMENT/PLAN:   No problem-specific Assessment & Plan notes found for this encounter.     Caro Laroche, DO

## 2022-12-16 ENCOUNTER — Ambulatory Visit: Payer: Self-pay | Admitting: Physician Assistant

## 2023-01-09 ENCOUNTER — Ambulatory Visit: Payer: Self-pay | Admitting: Nurse Practitioner

## 2023-01-10 ENCOUNTER — Ambulatory Visit: Payer: Self-pay | Admitting: Internal Medicine
